# Patient Record
Sex: Female | Born: 1960
Health system: Southern US, Community
[De-identification: ages and names within clinical notes are randomized; demographics above are authoritative.]

## PROBLEM LIST (undated history)

## (undated) DIAGNOSIS — R112 Nausea with vomiting, unspecified: Secondary | ICD-10-CM

## (undated) DIAGNOSIS — Z923 Personal history of irradiation: Secondary | ICD-10-CM

## (undated) DIAGNOSIS — C50919 Malignant neoplasm of unspecified site of unspecified female breast: Secondary | ICD-10-CM

## (undated) DIAGNOSIS — E785 Hyperlipidemia, unspecified: Secondary | ICD-10-CM

## (undated) DIAGNOSIS — Z9889 Other specified postprocedural states: Secondary | ICD-10-CM

## (undated) DIAGNOSIS — M1990 Unspecified osteoarthritis, unspecified site: Secondary | ICD-10-CM

## (undated) DIAGNOSIS — M810 Age-related osteoporosis without current pathological fracture: Secondary | ICD-10-CM

## (undated) HISTORY — PX: LAPAROSCOPY: SHX197

## (undated) HISTORY — DX: Other specified postprocedural states: Z98.890

## (undated) HISTORY — DX: Age-related osteoporosis without current pathological fracture: M81.0

## (undated) HISTORY — DX: Hyperlipidemia, unspecified: E78.5

## (undated) HISTORY — DX: Unspecified osteoarthritis, unspecified site: M19.90

## (undated) HISTORY — DX: Other specified postprocedural states: R11.2

---

## 1991-12-23 HISTORY — PX: DILATION AND EVACUATION: SHX1459

## 1998-05-05 ENCOUNTER — Ambulatory Visit (HOSPITAL_COMMUNITY): Admission: RE | Admit: 1998-05-05 | Discharge: 1998-05-05 | Payer: Self-pay | Admitting: Obstetrics and Gynecology

## 1998-05-06 ENCOUNTER — Inpatient Hospital Stay (HOSPITAL_COMMUNITY): Admission: AD | Admit: 1998-05-06 | Discharge: 1998-05-06 | Payer: Self-pay | Admitting: *Deleted

## 1998-06-21 ENCOUNTER — Other Ambulatory Visit: Admission: RE | Admit: 1998-06-21 | Discharge: 1998-06-21 | Payer: Self-pay | Admitting: Obstetrics and Gynecology

## 1998-07-30 ENCOUNTER — Encounter (HOSPITAL_COMMUNITY): Admission: RE | Admit: 1998-07-30 | Discharge: 1998-10-28 | Payer: Self-pay | Admitting: Gynecology

## 1999-05-01 ENCOUNTER — Other Ambulatory Visit: Admission: RE | Admit: 1999-05-01 | Discharge: 1999-05-01 | Payer: Self-pay | Admitting: *Deleted

## 1999-11-30 ENCOUNTER — Inpatient Hospital Stay (HOSPITAL_COMMUNITY): Admission: AD | Admit: 1999-11-30 | Discharge: 1999-12-02 | Payer: Self-pay | Admitting: Obstetrics & Gynecology

## 1999-12-31 ENCOUNTER — Other Ambulatory Visit: Admission: RE | Admit: 1999-12-31 | Discharge: 1999-12-31 | Payer: Self-pay | Admitting: *Deleted

## 2001-08-12 ENCOUNTER — Other Ambulatory Visit: Admission: RE | Admit: 2001-08-12 | Discharge: 2001-08-12 | Payer: Self-pay | Admitting: *Deleted

## 2001-12-31 ENCOUNTER — Other Ambulatory Visit: Admission: RE | Admit: 2001-12-31 | Discharge: 2001-12-31 | Payer: Self-pay | Admitting: *Deleted

## 2003-02-08 ENCOUNTER — Other Ambulatory Visit: Admission: RE | Admit: 2003-02-08 | Discharge: 2003-02-08 | Payer: Self-pay | Admitting: Obstetrics & Gynecology

## 2010-12-22 HISTORY — PX: BREAST LUMPECTOMY: SHX2

## 2011-06-02 ENCOUNTER — Encounter: Payer: Self-pay | Admitting: Gastroenterology

## 2011-06-02 ENCOUNTER — Ambulatory Visit (AMBULATORY_SURGERY_CENTER): Payer: BC Managed Care – PPO | Admitting: *Deleted

## 2011-06-02 VITALS — Ht 67.0 in | Wt 127.0 lb

## 2011-06-02 DIAGNOSIS — Z1211 Encounter for screening for malignant neoplasm of colon: Secondary | ICD-10-CM

## 2011-06-02 MED ORDER — PEG-KCL-NACL-NASULF-NA ASC-C 100 G PO SOLR: ORAL | Status: DC

## 2011-06-16 ENCOUNTER — Encounter: Payer: Self-pay | Admitting: Gastroenterology

## 2011-06-16 ENCOUNTER — Ambulatory Visit (AMBULATORY_SURGERY_CENTER): Payer: BC Managed Care – PPO | Admitting: Gastroenterology

## 2011-06-16 VITALS — BP 121/56 | HR 78 | Temp 98.4°F | Resp 18 | Ht 67.0 in | Wt 127.0 lb

## 2011-06-16 DIAGNOSIS — Z1211 Encounter for screening for malignant neoplasm of colon: Secondary | ICD-10-CM

## 2011-06-16 HISTORY — PX: COLONOSCOPY: SHX174

## 2011-06-16 MED ORDER — SODIUM CHLORIDE 0.9 % IV SOLN: 500.0000 mL | INTRAVENOUS | Status: DC

## 2011-06-16 NOTE — Patient Instructions (Signed)
Please review discharge instruction

## 2011-06-17 ENCOUNTER — Telehealth: Payer: Self-pay | Admitting: *Deleted

## 2011-06-17 NOTE — Telephone Encounter (Signed)
No ID on answering machine, no message left 

## 2011-06-17 NOTE — Telephone Encounter (Deleted)
Follow up Call- Patient questions:  Do you have a fever, pain , or abdominal swelling? {yes no:314532} Pain Score  {NUMBERS; 0-10:5044} *  Have you tolerated food without any problems? {yes no:314532}  Have you been able to return to your normal activities? {yes no:314532}  Do you have any questions about your discharge instructions: Diet   {yes no:314532} Medications  {yes no:314532} Follow up visit  {yes no:314532}  Do you have questions or concerns about your Care? {yes no:314532}  Actions: * If pain score is 4 or above: {ACTION; [facility] ENDO PAIN >4:21563::"No action needed, pain <4."}

## 2011-10-09 ENCOUNTER — Other Ambulatory Visit: Payer: Self-pay

## 2011-10-15 ENCOUNTER — Other Ambulatory Visit: Payer: Self-pay | Admitting: Radiology

## 2011-10-15 DIAGNOSIS — D0501 Lobular carcinoma in situ of right breast: Secondary | ICD-10-CM

## 2011-10-17 ENCOUNTER — Ambulatory Visit
Admission: RE | Admit: 2011-10-17 | Discharge: 2011-10-17 | Disposition: A | Discharge status: Home / Self Care | Payer: BC Managed Care – PPO | Source: Ambulatory Visit | Attending: Radiology | Admitting: Radiology

## 2011-10-17 DIAGNOSIS — D0501 Lobular carcinoma in situ of right breast: Secondary | ICD-10-CM

## 2011-10-17 MED ORDER — GADOBENATE DIMEGLUMINE 529 MG/ML IV SOLN
11.0000 mL | Freq: Once | INTRAVENOUS | Status: AC | PRN
  Administered 2011-10-17: 11 mL via INTRAVENOUS

## 2011-10-21 ENCOUNTER — Encounter (INDEPENDENT_AMBULATORY_CARE_PROVIDER_SITE_OTHER): Payer: Self-pay | Admitting: General Surgery

## 2011-10-21 ENCOUNTER — Ambulatory Visit (INDEPENDENT_AMBULATORY_CARE_PROVIDER_SITE_OTHER): Payer: BC Managed Care – PPO | Admitting: General Surgery

## 2011-10-21 VITALS — BP 132/84 | HR 88 | Temp 99.5°F | Resp 16 | Ht 67.0 in | Wt 124.2 lb

## 2011-10-21 DIAGNOSIS — D0590 Unspecified type of carcinoma in situ of unspecified breast: Secondary | ICD-10-CM

## 2011-10-21 DIAGNOSIS — D0501 Lobular carcinoma in situ of right breast: Secondary | ICD-10-CM | POA: Insufficient documentation

## 2011-10-21 NOTE — Progress Notes (Signed)
Chief Complaint  Patient presents with  . Other    new pt- eval rt br radial scar    HPI Connie Jones is a 50 y.o. female.  Referred by Dr. Dag Pavic HPI 50 yof who is otherwise healthy underwent her regular screening MMG 10/12 with finding of 3 small areas of calcifcation in the central to lateral periareolar breast.  Magnification views showed a cluster of mildly pleomorphic calcifications in the 12:00 position anteriorly.  The others appeared benign.  Stereotactic biopsy was performed with finding of ADH, LCIS arising in a radial scar.  Subsequently she underwent an MR that showed a 1.9x1.5x1.4 cm area at the biopsy proven LCIS.  There is also a 2.4x1.6x1.6 cm area of patchy non-mass enhancement at the location of some of the calcifications.  She reports no complaints referable to her breasts.    Past Medical History  Diagnosis Date  . Arthritis     mild in knees    Past Surgical History  Procedure Date  . Cesarean section   . Laparoscopy     Infertility studies    Family History  Problem Relation Age of Onset  . Colon polyps Father   . Colon cancer Paternal Grandfather   . Cancer Paternal Grandfather     colon    Social History History  Substance Use Topics  . Smoking status: Never Smoker   . Smokeless tobacco: Never Used  . Alcohol Use: 0.0 oz/week     rarely    No Known Allergies  Current Outpatient Prescriptions  Medication Sig Dispense Refill  . calcium citrate-vitamin D 200-200 MG-UNIT TABS Take 2 tablets by mouth 2 (two) times daily.        . glucosamine-chondroitin 500-400 MG tablet Take 3 tablets by mouth daily.        . Multiple Vitamins-Minerals (MULTIVITAMIN WITH MINERALS) tablet Take 1 tablet by mouth daily.         Current Facility-Administered Medications  Medication Dose Route Frequency Provider Last Rate Last Dose  . 0.9 %  sodium chloride infusion  500 mL Intravenous Continuous Daniel Jacobs, MD        Review of Systems Review of Systems    Constitutional: Negative for fever, chills and unexpected weight change.  HENT: Negative for hearing loss, congestion, sore throat, trouble swallowing and voice change.   Eyes: Negative for visual disturbance.  Respiratory: Negative for cough and wheezing.   Cardiovascular: Negative for chest pain, palpitations and leg swelling.  Gastrointestinal: Negative for nausea, vomiting, abdominal pain, diarrhea, constipation, blood in stool, abdominal distention and anal bleeding.  Genitourinary: Negative for hematuria, vaginal bleeding and difficulty urinating.  Musculoskeletal: Negative for arthralgias.  Skin: Negative for rash and wound.  Neurological: Negative for seizures, syncope and headaches.  Hematological: Negative for adenopathy. Does not bruise/bleed easily.  Psychiatric/Behavioral: Negative for confusion.    Blood pressure 132/84, pulse 88, temperature 99.5 F (37.5 C), temperature source Temporal, resp. rate 16, height 5' 7" (1.702 m), weight 124 lb 3.2 oz (56.337 kg).  Physical Exam Physical Exam  Constitutional: She appears well-developed and well-nourished.  Eyes: No scleral icterus.  Neck: Neck supple.  Cardiovascular: Normal rate, regular rhythm and normal heart sounds.   Pulmonary/Chest: Effort normal and breath sounds normal. She has no wheezes. She has no rales. Right breast exhibits no inverted nipple, no mass, no nipple discharge, no skin change and no tenderness. Left breast exhibits no inverted nipple, no mass, no nipple discharge, no skin change and   no tenderness. Breasts are symmetrical.  Lymphadenopathy:    She has no cervical adenopathy.    Data Reviewed MMG/us/MRI all reviewed along with pathology  Assessment    Right breast MMG abnormality with biopsy showing ADH, LCIS involved with CSL RIght breast MRI abnormality    Plan    I discussed the findings of biopsy as well as imaging findings for about an hour.   I recommended that she undergo an MR biopsy  of the patchy non mass enhancement that is associated with benign appearing calcifications.  The MR has been done prior to me seeing her today.  At this point with know LCIS/ADH and an abnormality on MR we will need to biopsy before deciding on a final surgical plan.  We will set this up for soon. I recommended that the other area undergo wire localization and excision.  There is a possibility that breast cancer could exist in this mass and we need to ensure there is not.  We discussed the procedure and the risks/benefits associated with it.  She understands if there is breast cancer found we would need to perform additional surgery most likely.  She understands if this is ADH/LCIS this is a high risk lesion and I will refer her to the cancer center and the risk reduction clinic for evaluation.   We will wait on the MR biopsy and then determine a final plan.       Pippa Hanif 10/21/2011, 8:14 PM    

## 2011-10-22 ENCOUNTER — Other Ambulatory Visit (INDEPENDENT_AMBULATORY_CARE_PROVIDER_SITE_OTHER): Payer: Self-pay | Admitting: General Surgery

## 2011-10-22 DIAGNOSIS — D0501 Lobular carcinoma in situ of right breast: Secondary | ICD-10-CM

## 2011-10-23 ENCOUNTER — Telehealth (INDEPENDENT_AMBULATORY_CARE_PROVIDER_SITE_OTHER): Payer: Self-pay

## 2011-10-23 NOTE — Telephone Encounter (Signed)
[name] pt's cell and home # to get her scheduled for a recheck visit after the MRI Bx. Per Dr Dwain Sarna can put pt on for 10-27-11.Connie Jones

## 2011-10-24 ENCOUNTER — Telehealth (INDEPENDENT_AMBULATORY_CARE_PROVIDER_SITE_OTHER): Payer: Self-pay

## 2011-10-24 ENCOUNTER — Ambulatory Visit
Admission: RE | Admit: 2011-10-24 | Discharge: 2011-10-24 | Disposition: A | Discharge status: Home / Self Care | Payer: BC Managed Care – PPO | Source: Ambulatory Visit | Attending: General Surgery | Admitting: General Surgery

## 2011-10-24 DIAGNOSIS — D0501 Lobular carcinoma in situ of right breast: Secondary | ICD-10-CM

## 2011-10-24 MED ORDER — GADOBENATE DIMEGLUMINE 529 MG/ML IV SOLN
11.0000 mL | Freq: Once | INTRAVENOUS | Status: AC | PRN
  Administered 2011-10-24: 11 mL via INTRAVENOUS

## 2011-10-24 NOTE — Telephone Encounter (Signed)
[name] at home and cell for pt to come see Dr Dwain Sarna on Monday 10-27-11/ [facility]

## 2011-10-27 ENCOUNTER — Telehealth (INDEPENDENT_AMBULATORY_CARE_PROVIDER_SITE_OTHER): Payer: Self-pay

## 2011-10-27 ENCOUNTER — Ambulatory Visit (INDEPENDENT_AMBULATORY_CARE_PROVIDER_SITE_OTHER): Payer: BC Managed Care – PPO | Admitting: General Surgery

## 2011-10-27 ENCOUNTER — Encounter (INDEPENDENT_AMBULATORY_CARE_PROVIDER_SITE_OTHER): Payer: Self-pay | Admitting: General Surgery

## 2011-10-27 ENCOUNTER — Other Ambulatory Visit (INDEPENDENT_AMBULATORY_CARE_PROVIDER_SITE_OTHER): Payer: Self-pay | Admitting: General Surgery

## 2011-10-27 VITALS — BP 120/82 | HR 60 | Temp 97.8°F | Resp 16 | Ht 67.0 in | Wt 121.4 lb

## 2011-10-27 DIAGNOSIS — R928 Other abnormal and inconclusive findings on diagnostic imaging of breast: Secondary | ICD-10-CM

## 2011-10-27 DIAGNOSIS — N63 Unspecified lump in unspecified breast: Secondary | ICD-10-CM

## 2011-10-27 NOTE — Telephone Encounter (Signed)
Pt returned my call and I did make her an appt for today with DR Dwain Sarna.Connie Jones

## 2011-10-27 NOTE — Progress Notes (Signed)
Subjective:     Patient ID: VALARIA KOHUT, female   DOB: Sep 10, 1961, 50 y.o.   MRN: [id number]  HPI This is a 50 year old female who I saw last week after she underwent a mammogram with an abnormality and a biopsy showing atypical ductal hyperplasia and lobular carcinoma in situ. Following day she underwent an MRI which showed additional area of abnormality. This is now the MR biopsied showing a radial scar. I have reviewed her images with Dr. Jean Rosenthal. She reports no complaints.  Review of Systems     Objective:   Physical Exam     Assessment:     Right breast radial scar and right breast adh and lcis    Plan:        After reviewing the case with radiology I think the best plan would be to excise both of these areas. They are 1.2 cm away on her mammogram. She does have a fairly small breasts and I'm concerned about removing a large amount of tissue on her. I think he would be most reasonable to wire localize both of these areas and removed the tissue in between them as well. At a minimum she'll need to go to the risk reduction clinic in followup with MRIs as well as mammograms. If there is any cancer we will need to do another operation. We discussed the risks associated with we'll plan on performing this as soon as possible.

## 2011-10-27 NOTE — Telephone Encounter (Signed)
[name] on cell and home #'s again for pt to come in for appt with Dr Dwain Sarna today. I left directions if pt calls in to office today I want to speak to pt pls. Do not send to my voicemail./ [facility]

## 2011-11-03 ENCOUNTER — Encounter (INDEPENDENT_AMBULATORY_CARE_PROVIDER_SITE_OTHER): Payer: Self-pay | Admitting: General Surgery

## 2011-11-04 ENCOUNTER — Other Ambulatory Visit: Payer: Self-pay

## 2011-11-04 ENCOUNTER — Encounter (HOSPITAL_BASED_OUTPATIENT_CLINIC_OR_DEPARTMENT_OTHER)
Admission: RE | Admit: 2011-11-04 | Discharge: 2011-11-04 | Disposition: A | Discharge status: Home / Self Care | Payer: BC Managed Care – PPO | Source: Ambulatory Visit | Attending: General Surgery | Admitting: General Surgery

## 2011-11-04 ENCOUNTER — Encounter (HOSPITAL_BASED_OUTPATIENT_CLINIC_OR_DEPARTMENT_OTHER): Payer: Self-pay | Admitting: *Deleted

## 2011-11-04 LAB — BASIC METABOLIC PANEL
BUN: 11 mg/dL (ref 6–23)
CO2: 29 mEq/L (ref 19–32)
Glucose, Bld: 100 mg/dL — ABNORMAL HIGH (ref 70–99)
Potassium: 4.4 mEq/L (ref 3.5–5.1)
Sodium: 139 mEq/L (ref 135–145)

## 2011-11-04 LAB — CBC
Hemoglobin: 14 g/dL (ref 12.0–15.0)
MCH: 31.7 pg (ref 26.0–34.0)
MCV: 93.7 fL (ref 78.0–100.0)
RBC: 4.41 MIL/uL (ref 3.87–5.11)

## 2011-11-04 NOTE — Progress Notes (Signed)
To come in for labs and ekg All p/p mcsc reviewed

## 2011-11-05 ENCOUNTER — Other Ambulatory Visit (INDEPENDENT_AMBULATORY_CARE_PROVIDER_SITE_OTHER): Payer: Self-pay | Admitting: General Surgery

## 2011-11-05 ENCOUNTER — Ambulatory Visit (HOSPITAL_BASED_OUTPATIENT_CLINIC_OR_DEPARTMENT_OTHER)
Admission: RE | Admit: 2011-11-05 | Discharge: 2011-11-06 | Disposition: A | Discharge status: Home / Self Care | Payer: BC Managed Care – PPO | Source: Ambulatory Visit | Attending: General Surgery | Admitting: General Surgery

## 2011-11-05 ENCOUNTER — Encounter (HOSPITAL_BASED_OUTPATIENT_CLINIC_OR_DEPARTMENT_OTHER): Payer: Self-pay | Admitting: Anesthesiology

## 2011-11-05 ENCOUNTER — Ambulatory Visit (HOSPITAL_BASED_OUTPATIENT_CLINIC_OR_DEPARTMENT_OTHER): Payer: BC Managed Care – PPO | Admitting: Anesthesiology

## 2011-11-05 ENCOUNTER — Other Ambulatory Visit (INDEPENDENT_AMBULATORY_CARE_PROVIDER_SITE_OTHER): Payer: Self-pay | Admitting: Surgery

## 2011-11-05 ENCOUNTER — Encounter (HOSPITAL_BASED_OUTPATIENT_CLINIC_OR_DEPARTMENT_OTHER): Payer: Self-pay | Admitting: *Deleted

## 2011-11-05 ENCOUNTER — Encounter (HOSPITAL_BASED_OUTPATIENT_CLINIC_OR_DEPARTMENT_OTHER)
Admission: RE | Disposition: A | Discharge status: Home / Self Care | Payer: Self-pay | Source: Ambulatory Visit | Attending: General Surgery

## 2011-11-05 DIAGNOSIS — D0590 Unspecified type of carcinoma in situ of unspecified breast: Secondary | ICD-10-CM

## 2011-11-05 DIAGNOSIS — N6089 Other benign mammary dysplasias of unspecified breast: Secondary | ICD-10-CM | POA: Insufficient documentation

## 2011-11-05 DIAGNOSIS — Z01810 Encounter for preprocedural cardiovascular examination: Secondary | ICD-10-CM | POA: Insufficient documentation

## 2011-11-05 DIAGNOSIS — Z01812 Encounter for preprocedural laboratory examination: Secondary | ICD-10-CM | POA: Insufficient documentation

## 2011-11-05 HISTORY — PX: BREAST BIOPSY: SHX20

## 2011-11-05 SURGERY — BREAST BIOPSY WITH NEEDLE LOCALIZATION
Anesthesia: General | Site: Breast | Laterality: Right | Wound class: Clean

## 2011-11-05 MED ORDER — LACTATED RINGERS IV SOLN
INTRAVENOUS | Status: DC
  Administered 2011-11-05 (×4): via INTRAVENOUS

## 2011-11-05 MED ORDER — FENTANYL CITRATE 0.05 MG/ML IJ SOLN
INTRAMUSCULAR | Status: DC | PRN
  Administered 2011-11-05: 100 ug via INTRAVENOUS

## 2011-11-05 MED ORDER — PROMETHAZINE HCL 25 MG/ML IJ SOLN
12.5000 mg | Freq: Four times a day (QID) | INTRAMUSCULAR | Status: DC | PRN
  Administered 2011-11-05: 12.5 mg via INTRAVENOUS

## 2011-11-05 MED ORDER — CEFAZOLIN SODIUM 1-5 GM-% IV SOLN
1.0000 g | INTRAVENOUS | Status: AC
  Administered 2011-11-05: 1 g via INTRAVENOUS

## 2011-11-05 MED ORDER — ONDANSETRON HCL 4 MG/2ML IJ SOLN
4.0000 mg | Freq: Once | INTRAMUSCULAR | Status: AC | PRN
  Administered 2011-11-05: 4 mg via INTRAVENOUS

## 2011-11-05 MED ORDER — MIDAZOLAM HCL 5 MG/5ML IJ SOLN
INTRAMUSCULAR | Status: DC | PRN
  Administered 2011-11-05: 2 mg via INTRAVENOUS
  Administered 2011-11-05: 1 mg via INTRAVENOUS

## 2011-11-05 MED ORDER — BUPIVACAINE HCL (PF) 0.25 % IJ SOLN
INTRAMUSCULAR | Status: DC | PRN
  Administered 2011-11-05: 10 mL

## 2011-11-05 MED ORDER — MORPHINE SULFATE 2 MG/ML IJ SOLN: 2.0000 mg | INTRAMUSCULAR | Status: DC | PRN

## 2011-11-05 MED ORDER — ONDANSETRON HCL 4 MG/2ML IJ SOLN
INTRAMUSCULAR | Status: DC | PRN
  Administered 2011-11-05: 4 mg via INTRAVENOUS

## 2011-11-05 MED ORDER — SODIUM CHLORIDE 0.45 % IV SOLN: INTRAVENOUS | Status: DC

## 2011-11-05 MED ORDER — PROPOFOL 10 MG/ML IV EMUL
INTRAVENOUS | Status: DC | PRN
  Administered 2011-11-05: 200 mg via INTRAVENOUS

## 2011-11-05 MED ORDER — MEPERIDINE HCL 25 MG/ML IJ SOLN: 6.2500 mg | INTRAMUSCULAR | Status: DC | PRN

## 2011-11-05 MED ORDER — DROPERIDOL 2.5 MG/ML IJ SOLN
0.6250 mg | Freq: Once | INTRAMUSCULAR | Status: AC
  Administered 2011-11-05: 0.625 mg via INTRAVENOUS

## 2011-11-05 MED ORDER — LIDOCAINE HCL (CARDIAC) 20 MG/ML IV SOLN
INTRAVENOUS | Status: DC | PRN
  Administered 2011-11-05: 100 mg via INTRAVENOUS

## 2011-11-05 MED ORDER — OXYCODONE-ACETAMINOPHEN 5-325 MG PO TABS: 1.0000 | ORAL_TABLET | ORAL | Status: AC | PRN

## 2011-11-05 MED ORDER — OXYCODONE-ACETAMINOPHEN 5-325 MG PO TABS: 1.0000 | ORAL_TABLET | ORAL | Status: DC | PRN

## 2011-11-05 MED ORDER — HYDROMORPHONE HCL PF 1 MG/ML IJ SOLN
0.2500 mg | INTRAMUSCULAR | Status: DC | PRN
  Administered 2011-11-05: 0.25 mg via INTRAVENOUS
  Administered 2011-11-05 (×2): 0.5 mg via INTRAVENOUS
  Administered 2011-11-05: 0.25 mg via INTRAVENOUS

## 2011-11-05 MED ORDER — ONDANSETRON HCL 4 MG/2ML IJ SOLN: 4.0000 mg | Freq: Four times a day (QID) | INTRAMUSCULAR | Status: DC | PRN

## 2011-11-05 SURGICAL SUPPLY — 51 items
APL SKNCLS STERI-STRIP NONHPOA (GAUZE/BANDAGES/DRESSINGS) ×1
APPLIER CLIP 9.375 MED OPEN (MISCELLANEOUS)
BENZOIN TINCTURE PRP APPL 2/3 (GAUZE/BANDAGES/DRESSINGS) ×2 IMPLANT
BINDER BREAST LRG (GAUZE/BANDAGES/DRESSINGS) ×2 IMPLANT
BINDER BREAST MEDIUM (GAUZE/BANDAGES/DRESSINGS) IMPLANT
BINDER BREAST XLRG (GAUZE/BANDAGES/DRESSINGS) IMPLANT
BINDER BREAST XXLRG (GAUZE/BANDAGES/DRESSINGS) IMPLANT
BLADE SURG 15 STRL LF DISP TIS (BLADE) ×1 IMPLANT
BLADE SURG 15 STRL SS (BLADE) ×2
CANISTER SUCTION 1200CC (MISCELLANEOUS) IMPLANT
CHLORAPREP W/TINT 26ML (MISCELLANEOUS) ×2 IMPLANT
CLIP APPLIE 9.375 MED OPEN (MISCELLANEOUS) IMPLANT
CLOTH BEACON ORANGE TIMEOUT ST (SAFETY) ×2 IMPLANT
COVER MAYO STAND STRL (DRAPES) ×2 IMPLANT
COVER TABLE BACK 60X90 (DRAPES) ×2 IMPLANT
DECANTER SPIKE VIAL GLASS SM (MISCELLANEOUS) IMPLANT
DERMABOND ADVANCED (GAUZE/BANDAGES/DRESSINGS)
DERMABOND ADVANCED .7 DNX12 (GAUZE/BANDAGES/DRESSINGS) IMPLANT
DEVICE DUBIN W/COMP PLATE 8390 (MISCELLANEOUS) ×2 IMPLANT
DRAPE PED LAPAROTOMY (DRAPES) ×2 IMPLANT
DRSG TEGADERM 4X4.75 (GAUZE/BANDAGES/DRESSINGS) ×2 IMPLANT
ELECT COATED BLADE 2.86 ST (ELECTRODE) ×2 IMPLANT
ELECT REM PT RETURN 9FT ADLT (ELECTROSURGICAL) ×2
ELECTRODE REM PT RTRN 9FT ADLT (ELECTROSURGICAL) ×1 IMPLANT
GAUZE SPONGE 4X4 12PLY STRL LF (GAUZE/BANDAGES/DRESSINGS) ×2 IMPLANT
GLOVE BIO SURGEON STRL SZ7 (GLOVE) ×4 IMPLANT
GLOVE BIOGEL PI IND STRL 7.5 (GLOVE) ×1 IMPLANT
GLOVE BIOGEL PI INDICATOR 7.5 (GLOVE) ×1
GOWN PREVENTION PLUS XLARGE (GOWN DISPOSABLE) ×2 IMPLANT
GOWN STRL REIN 2XL LVL4 (GOWN DISPOSABLE) ×2 IMPLANT
NEEDLE HYPO 25X1 1.5 SAFETY (NEEDLE) ×2 IMPLANT
NS IRRIG 1000ML POUR BTL (IV SOLUTION) IMPLANT
PACK BASIN DAY SURGERY FS (CUSTOM PROCEDURE TRAY) ×2 IMPLANT
PENCIL BUTTON HOLSTER BLD 10FT (ELECTRODE) ×2 IMPLANT
SLEEVE SCD COMPRESS KNEE MED (MISCELLANEOUS) ×2 IMPLANT
SPONGE GAUZE 2X2 12PLY UNSTER (GAUZE/BANDAGES/DRESSINGS) ×2 IMPLANT
SPONGE LAP 4X18 X RAY DECT (DISPOSABLE) ×2 IMPLANT
STRIP CLOSURE SKIN 1/2X4 (GAUZE/BANDAGES/DRESSINGS) ×2 IMPLANT
SUT MNCRL AB 4-0 PS2 18 (SUTURE) ×2 IMPLANT
SUT SILK 2 0 SH (SUTURE) ×2 IMPLANT
SUT VIC AB 2-0 SH 27 (SUTURE) ×1
SUT VIC AB 2-0 SH 27XBRD (SUTURE) ×1 IMPLANT
SUT VIC AB 3-0 SH 27 (SUTURE) ×2
SUT VIC AB 3-0 SH 27X BRD (SUTURE) ×1 IMPLANT
SUT VICRYL AB 3 0 TIES (SUTURE) IMPLANT
SYR CONTROL 10ML LL (SYRINGE) ×2 IMPLANT
TOWEL OR 17X24 6PK STRL BLUE (TOWEL DISPOSABLE) ×2 IMPLANT
TOWEL OR NON WOVEN STRL DISP B (DISPOSABLE) ×2 IMPLANT
TUBE CONNECTING 20X1/4 (TUBING) IMPLANT
WATER STERILE IRR 1000ML POUR (IV SOLUTION) ×2 IMPLANT
YANKAUER SUCT BULB TIP NO VENT (SUCTIONS) IMPLANT

## 2011-11-05 NOTE — Anesthesia Procedure Notes (Addendum)
Performed by: Radford Pax   Procedure Name: LMA Insertion Date/Time: 11/05/2011 4:34 PM Performed by: Radford Pax Pre-anesthesia Checklist: Patient identified, Timeout performed, Emergency Drugs available, Patient being monitored and Suction available Patient Re-evaluated:Patient Re-evaluated prior to inductionOxygen Delivery Method: Circle System Utilized Preoxygenation: Pre-oxygenation with 100% oxygen Intubation Type: IV induction Ventilation: Mask ventilation without difficulty LMA: LMA inserted LMA Size: 4.0 Number of attempts: 1 Placement Confirmation: positive ETCO2 Tube secured with: Tape (plastic tape, bite gard right posterior) Dental Injury: Teeth and Oropharynx as per pre-operative assessment

## 2011-11-05 NOTE — Addendum Note (Signed)
Addendum  created 11/05/11 1836 by Kerby Nora, MD   Modules edited:Orders

## 2011-11-05 NOTE — Progress Notes (Signed)
Patient continues to experience nausea and vomiting despite antiemetic meds and volume infusion. Has not been able to tolerate even ice chips po. Dr. Ivin Booty in to evaluate. Advised overnight stay in [facility]. Dr. Luisa Hart paged and responded. In agreement with plan to keep patient overnight. Will place orders.

## 2011-11-05 NOTE — Anesthesia Preprocedure Evaluation (Addendum)
Anesthesia Evaluation  Patient identified by MRN, date of birth, ID band Patient awake    Reviewed: Allergy & Precautions, H&P , Patient's Chart, lab work & pertinent test results  Airway       Dental   Pulmonary          Cardiovascular     Neuro/Psych    GI/Hepatic   Endo/Other    Renal/GU      Musculoskeletal   Abdominal   Peds  Hematology   Anesthesia Other Findings   Reproductive/Obstetrics                           Anesthesia Physical Anesthesia Plan  ASA: II  Anesthesia Plan: General   Post-op Pain Management:    Induction:   Airway Management Planned:   Additional Equipment:   Intra-op Plan:   Post-operative Plan:   Informed Consent: I have reviewed the patients History and Physical, chart, labs and discussed the procedure including the risks, benefits and alternatives for the proposed anesthesia with the patient or authorized representative who has indicated his/her understanding and acceptance.     Plan Discussed with: CRNA and Surgeon  Anesthesia Plan Comments:         Anesthesia Quick Evaluation  

## 2011-11-05 NOTE — Interval H&P Note (Signed)
History and Physical Interval Note:   11/05/2011   3:15 PM   Connie Jones  has presented today for surgery, with the diagnosis of rt breast mass  The various methods of treatment have been discussed with the patient and family. After consideration of risks, benefits and other options for treatment, the patient has consented to  Procedure(s): BREAST BIOPSY WITH NEEDLE LOCALIZATION as a surgical intervention .  The patients' history has been reviewed, patient examined, no change in status, stable for surgery.  I have reviewed the patients' chart and labs.  Questions were answered to the patient's satisfaction.     [facility]  MD

## 2011-11-05 NOTE — Anesthesia Postprocedure Evaluation (Signed)
  Anesthesia Post-op Note  Patient: Connie Jones  Procedure(s) Performed:  BREAST BIOPSY WITH NEEDLE LOCALIZATION - needle localization times two at [facility] 12:30  Patient Location: PACU  Anesthesia Type: General  Level of Consciousness: awake, alert  and oriented  Airway and Oxygen Therapy: Patient Spontanous Breathing and Patient connected to face mask oxygen  Post-op Pain: mild  Post-op Assessment: Post-op Vital signs reviewed, Patient's Cardiovascular Status Stable, Respiratory Function Stable, Patent Airway, No signs of Nausea or vomiting and Pain level controlled  Post-op Vital Signs: Reviewed and stable  Complications: No apparent anesthesia complications

## 2011-11-05 NOTE — H&P (View-Only) (Signed)
Chief Complaint  Patient presents with  . Other    new pt- eval rt br radial scar    HPI Connie Jones is a 50 y.o. female.  Referred by Dr. Dominga Ferry HPI 47 yof who is otherwise healthy underwent her regular screening MMG 10/12 with finding of 3 small areas of calcifcation in the central to lateral periareolar breast.  Magnification views showed a cluster of mildly pleomorphic calcifications in the 12:00 position anteriorly.  The others appeared benign.  Stereotactic biopsy was performed with finding of ADH, LCIS arising in a radial scar.  Subsequently she underwent an MR that showed a 1.9x1.5x1.4 cm area at the biopsy proven LCIS.  There is also a 2.4x1.6x1.6 cm area of patchy non-mass enhancement at the location of some of the calcifications.  She reports no complaints referable to her breasts.    Past Medical History  Diagnosis Date  . Arthritis     mild in knees    Past Surgical History  Procedure Date  . Cesarean section   . Laparoscopy     Infertility studies    Family History  Problem Relation Age of Onset  . Colon polyps Father   . Colon cancer Paternal Grandfather   . Cancer Paternal Grandfather     colon    Social History History  Substance Use Topics  . Smoking status: Never Smoker   . Smokeless tobacco: Never Used  . Alcohol Use: 0.0 oz/week     rarely    No Known Allergies  Current Outpatient Prescriptions  Medication Sig Dispense Refill  . calcium citrate-vitamin D 200-200 MG-UNIT TABS Take 2 tablets by mouth 2 (two) times daily.        Marland Kitchen glucosamine-chondroitin 500-400 MG tablet Take 3 tablets by mouth daily.        . Multiple Vitamins-Minerals (MULTIVITAMIN WITH MINERALS) tablet Take 1 tablet by mouth daily.         Current Facility-Administered Medications  Medication Dose Route Frequency Provider Last Rate Last Dose  . 0.9 %  sodium chloride infusion  500 mL Intravenous Continuous Rob Bunting, MD        Review of Systems Review of Systems    Constitutional: Negative for fever, chills and unexpected weight change.  HENT: Negative for hearing loss, congestion, sore throat, trouble swallowing and voice change.   Eyes: Negative for visual disturbance.  Respiratory: Negative for cough and wheezing.   Cardiovascular: Negative for chest pain, palpitations and leg swelling.  Gastrointestinal: Negative for nausea, vomiting, abdominal pain, diarrhea, constipation, blood in stool, abdominal distention and anal bleeding.  Genitourinary: Negative for hematuria, vaginal bleeding and difficulty urinating.  Musculoskeletal: Negative for arthralgias.  Skin: Negative for rash and wound.  Neurological: Negative for seizures, syncope and headaches.  Hematological: Negative for adenopathy. Does not bruise/bleed easily.  Psychiatric/Behavioral: Negative for confusion.    Blood pressure 132/84, pulse 88, temperature 99.5 F (37.5 C), temperature source Temporal, resp. rate 16, height 5\' 7"  (1.702 m), weight 124 lb 3.2 oz (56.337 kg).  Physical Exam Physical Exam  Constitutional: She appears well-developed and well-nourished.  Eyes: No scleral icterus.  Neck: Neck supple.  Cardiovascular: Normal rate, regular rhythm and normal heart sounds.   Pulmonary/Chest: Effort normal and breath sounds normal. She has no wheezes. She has no rales. Right breast exhibits no inverted nipple, no mass, no nipple discharge, no skin change and no tenderness. Left breast exhibits no inverted nipple, no mass, no nipple discharge, no skin change and  no tenderness. Breasts are symmetrical.  Lymphadenopathy:    She has no cervical adenopathy.    Data Reviewed MMG/us/MRI all reviewed along with pathology  Assessment    Right breast MMG abnormality with biopsy showing ADH, LCIS involved with CSL RIght breast MRI abnormality    Plan    I discussed the findings of biopsy as well as imaging findings for about an hour.   I recommended that she undergo an MR biopsy  of the patchy non mass enhancement that is associated with benign appearing calcifications.  The MR has been done prior to me seeing her today.  At this point with know LCIS/ADH and an abnormality on MR we will need to biopsy before deciding on a final surgical plan.  We will set this up for soon. I recommended that the other area undergo wire localization and excision.  There is a possibility that breast cancer could exist in this mass and we need to ensure there is not.  We discussed the procedure and the risks/benefits associated with it.  She understands if there is breast cancer found we would need to perform additional surgery most likely.  She understands if this is ADH/LCIS this is a high risk lesion and I will refer her to the cancer center and the risk reduction clinic for evaluation.   We will wait on the MR biopsy and then determine a final plan.       Connie Jones 10/21/2011, 8:14 PM

## 2011-11-05 NOTE — Transfer of Care (Signed)
Immediate Anesthesia Transfer of Care Note  Patient: Connie Jones  Procedure(s) Performed:  BREAST BIOPSY WITH NEEDLE LOCALIZATION - needle localization times two at [facility] 12:30  Patient Location: PACU  Anesthesia Type: MAC  Level of Consciousness: sedated  Airway & Oxygen Therapy: Patient Spontanous Breathing and Patient connected to face mask  Post-op Assessment: Report given to PACU RN and Post -op Vital signs reviewed and stable  Post vital signs: Reviewed and stable  Complications: No apparent anesthesia complications

## 2011-11-05 NOTE — Op Note (Signed)
Preoperative diagnosis: Right breast microcalcifications with core biopsy showing LCIS and atypical ductal hyperplasia  Postoperative diagnosis: Same as above  Procedure: Right breast wire localization biopsy  Surgeon: Dr. Harden Mo  Anesthesia: Gen. With LMA  Estimated blood loss: Minimal  Drains: None  Sponge and needle counts correct x2 at end of operation  Findings: Mammogram with removal of clips, prior, lesion  Indications: This is a 50 year old female who underwent a routine screening mammogram with an abnormality that was biopsied. She also underwent an MRI biopsy of the lesion. Both of these areas show lobular carcinoma in situ and atypical ductal hyperplasia. We discussed a wire localization biopsy of this abnormal area. We discussed there may be cancer associated with this performed right now both of these lesions are benign. We discussed the risks and benefits prior to proceeding.  Procedure: She first had a  wire placed at the breast center. I have the mammograms available for my review during the operation. She was taken to the operating room and was administered 1 g of intravenous cefazolin. Sequential compression devices were placed on her lower extremities prior to induction with anesthesia. She was then placed under general anesthesia with an LMA. The right breast was prepped and draped in the standard sterile surgical fashion. A surgical timeout was then performed.  I made a periareolar incision. I then used cautery to excise the wire and the surrounding tissue. This was done without difficulty. I marked the specimen with a short stitch superior, long stitch lateral, and double stitch in the couple of other small areas that got separated from the lesion during the dissection as there was just some fatty tissue attaching them and these were sent with the specimen also. Mammogram was taken confirming removal of the clips, wire, and lesion. This was confirmed by radiology.  Hemostasis was then obtained. I closed this with a 2-0 Vicryl 3-0 Vicryl and a 4-0 Monocryl for the skin. I infiltrated 10 cc of quarter percent Marcaine upon completion. I then placed Steri-Strips and a sterile dressing. She tolerated this well and was transferred to the recovery room in stable condition.

## 2011-11-06 ENCOUNTER — Other Ambulatory Visit (INDEPENDENT_AMBULATORY_CARE_PROVIDER_SITE_OTHER): Payer: Self-pay | Admitting: General Surgery

## 2011-11-06 MED ORDER — PROMETHAZINE HCL 12.5 MG PO TABS: 12.5000 mg | ORAL_TABLET | Freq: Four times a day (QID) | ORAL | Status: AC | PRN

## 2011-11-07 ENCOUNTER — Encounter (INDEPENDENT_AMBULATORY_CARE_PROVIDER_SITE_OTHER): Payer: Self-pay | Admitting: General Surgery

## 2011-11-10 ENCOUNTER — Encounter (HOSPITAL_BASED_OUTPATIENT_CLINIC_OR_DEPARTMENT_OTHER): Payer: Self-pay | Admitting: General Surgery

## 2011-11-10 ENCOUNTER — Telehealth (INDEPENDENT_AMBULATORY_CARE_PROVIDER_SITE_OTHER): Payer: Self-pay

## 2011-11-10 NOTE — Telephone Encounter (Signed)
[name] of pt's scheduled appt time to come back and see Dr Dwain Sarna. I just advised pt to leave me a voicemail stating she did receive her appt time on her v.m./ [facility]

## 2011-11-11 ENCOUNTER — Telehealth (INDEPENDENT_AMBULATORY_CARE_PROVIDER_SITE_OTHER): Payer: Self-pay

## 2011-11-11 DIAGNOSIS — D0510 Intraductal carcinoma in situ of unspecified breast: Secondary | ICD-10-CM

## 2011-11-11 NOTE — Telephone Encounter (Signed)
Pt called in requesting her path report. I advised pt that Dr Dwain Sarna was not in the office the whole week but I would look up her path report. I advised pt that her path shows low grade DCIS with margins negative for malignancy. I advised pt that I could go ahead and send her referral to [facility] to see medical/radiation oncology. The pt has a lot of questions about her path report I trid to answer as much as possible but I explained that she will have to really talk to Dr Dwain Sarna on Monday. I did advise that the path report was good report if you were going to get a path report about the breast b/c it was an early detection of cancer cells for high risk. The pt understands will see [place] Monday./ [facility]

## 2011-11-17 ENCOUNTER — Ambulatory Visit (INDEPENDENT_AMBULATORY_CARE_PROVIDER_SITE_OTHER): Payer: BC Managed Care – PPO | Admitting: General Surgery

## 2011-11-17 ENCOUNTER — Encounter (INDEPENDENT_AMBULATORY_CARE_PROVIDER_SITE_OTHER): Payer: Self-pay | Admitting: General Surgery

## 2011-11-17 VITALS — BP 124/82 | HR 60 | Temp 98.0°F | Resp 16 | Ht 67.0 in | Wt 120.2 lb

## 2011-11-17 DIAGNOSIS — Z09 Encounter for follow-up examination after completed treatment for conditions other than malignant neoplasm: Secondary | ICD-10-CM

## 2011-11-17 DIAGNOSIS — D0511 Intraductal carcinoma in situ of right breast: Secondary | ICD-10-CM | POA: Insufficient documentation

## 2011-11-17 NOTE — Progress Notes (Signed)
Subjective:     Patient ID: Connie Jones, female   DOB: 1961-11-07, 50 y.o.   MRN: [id number]  HPI 61 yof who underwent excisional biopsy of ADH and LCIS on core biopsy of two right breast lesions.  She is doing well without any real complaints postop.  Her final path shows DCIS 2 mm in size that is low grade and 2 mm from her inferior margin.  She comes in today to discuss her pathology results and for further discussion about adjuvant therapy and surgical options.  Review of Systems     Objective:   Physical Exam Well healing right breast incision without infection    Assessment:     S/p right breast excisional biopsy with stage 0 right breast cancer    Plan:     We discussed at length today her diagnosis.  We went over all the therapies for breast cancer.  We specifically discussed her DCIS and the options of lumpectomy or mastectomy moving forward.  I think if she chooses lumpectomy our margins are 2 mm and as long as she undergoes radiation therapy this should be adequate.  I do think we should get another mmg to insure there are no other abnormalities.  If this is ok, then bct is reasonable. If this shows other abnormalities that remain we will need to discuss reoperation which may be a mastectomy.  We discussed local control with lumpectomy/xrt vs mastectomy as well as long term survival.  We also discussed radiation therapy consul prior to decision making as she has a lot of questions about radiation therapy as well as its side effects.  We discussed antiestrogen therapy as well.  We will get her mmg early next week due to postop soreness and then follow up soon after that.

## 2011-11-18 ENCOUNTER — Telehealth (INDEPENDENT_AMBULATORY_CARE_PROVIDER_SITE_OTHER): Payer: Self-pay

## 2011-11-18 DIAGNOSIS — D0510 Intraductal carcinoma in situ of unspecified breast: Secondary | ICD-10-CM

## 2011-11-18 NOTE — Telephone Encounter (Signed)
[name] for pt giving her an appt with Dr Hal Neer for 11-19-11 @2 :45/ [facility]

## 2011-11-18 NOTE — Telephone Encounter (Signed)
Called pt to notify her of the mgm date for repeat right side mgm on 11-28-11 @9 :50/ [facility]

## 2011-11-19 ENCOUNTER — Encounter: Payer: Self-pay | Admitting: Radiation Oncology

## 2011-11-19 ENCOUNTER — Ambulatory Visit
Admission: RE | Admit: 2011-11-19 | Discharge: 2011-11-19 | Disposition: A | Discharge status: Home / Self Care | Payer: BC Managed Care – PPO | Source: Ambulatory Visit | Attending: Radiation Oncology | Admitting: Radiation Oncology

## 2011-11-19 VITALS — BP 113/77 | HR 94 | Temp 98.5°F | Ht 67.0 in | Wt 122.8 lb

## 2011-11-19 DIAGNOSIS — Y842 Radiological procedure and radiotherapy as the cause of abnormal reaction of the patient, or of later complication, without mention of misadventure at the time of the procedure: Secondary | ICD-10-CM | POA: Insufficient documentation

## 2011-11-19 DIAGNOSIS — Z510 Encounter for antineoplastic radiation therapy: Secondary | ICD-10-CM | POA: Insufficient documentation

## 2011-11-19 DIAGNOSIS — D0590 Unspecified type of carcinoma in situ of unspecified breast: Secondary | ICD-10-CM | POA: Insufficient documentation

## 2011-11-19 DIAGNOSIS — Z170 Estrogen receptor positive status [ER+]: Secondary | ICD-10-CM | POA: Insufficient documentation

## 2011-11-19 DIAGNOSIS — L589 Radiodermatitis, unspecified: Secondary | ICD-10-CM | POA: Insufficient documentation

## 2011-11-19 DIAGNOSIS — D0510 Intraductal carcinoma in situ of unspecified breast: Secondary | ICD-10-CM

## 2011-11-19 HISTORY — DX: Malignant neoplasm of unspecified site of unspecified female breast: C50.919

## 2011-11-19 NOTE — Progress Notes (Signed)
Married Menses age 50 LMO 2010 took low dose HRT  For 2 years Children ages 37 and 83

## 2011-11-19 NOTE — Progress Notes (Signed)
Please see the Nurse Progress Note in the MD Initial Consult Encounter for this patient. 

## 2011-11-21 NOTE — Progress Notes (Signed)
CC:   Connie Gosling, MD  DIAGNOSIS:  Ductal carcinoma in situ of the right breast.  PREVIOUS INTERVENTIONS:  Right lumpectomy on 11/05/2011 revealing a 0.2 cm low-grade ductal carcinoma in situ on the background of a complex sclerosing lesion with microcalcifications and a 0.2 inferior margin, ER/PR positive.  HISTORY OF PRESENT ILLNESS:  Connie Jones is a pleasant 50 year old female who went in for a regular screening mammogram.  This showed 3 areas of calcification in the lateral aspect of the breast.  Magnification views showed a cluster of pleomorphic calcifications in the anterior 12 o'clock position.  The other areas on magnification views appeared benign.  A stereotactic biopsy was performed of these pleomorphic calcifications showing ADH and LCIS arising in a radial scar.  An MRI of the bilateral breasts was performed on 10/17/2011.  This showed nodular non masslike enhancement area measuring 1.9 x 1.5 cm.  A 2.4 x 1.6 x 1.6 area of non masslike enhancement was also noted in the upper outer quadrant of the right breast at the location of the additional microcalcifications.  This was concerning for LCIS, and consideration of biopsy is warranted.  An MRI-guided biopsy was performed on 10/24/2011, revealing a complex sclerosing lesion with atypical lobular hyperplasia. Excision was recommended.  After discussion with the patient, Dr. Dwain Sarna performed excision of both these areas on 11/05/2011.  Ductal carcinoma in situ was noted on the background of complex sclerosing lesion with microcalcifications and previous biopsy changes.  The closest margin was inferiorly at 0.2 cm.  Dr. Dwain Sarna discussed the results of the biopsy with her.  I should note that her specimen mammogram showed all calcifications to excised.  Dr. Dwain Sarna referred her to me for consideration of radiation in the management of her newly diagnosed DCIS.  In speaking with Connie Jones, she is quite concerned  about the area seen on her biopsy.  She is also concerned that we are "over treating" her DCIS. She has a followup mammogram scheduled for December 7th and has healed up well from her surgery.  She has no breast related complaints today.  PAST MEDICAL HISTORY:  Breast cancer, arthritis, status post C-section, status post laparoscopy.  FAMILY HISTORY:  She had a sister with anesthesia difficulties.  There is no family history of malignancy.  SOCIAL HISTORY:  She is a nonsmoker.  She denies any smokeless tobacco use.  She admits to rare alcohol consumption.  She is married.  MEDICATIONS:  Multivitamin, calcium with vitamin D, and glucosamine and chondroitin.  GYN HISTORY:  She is GX, P2.  Her last menstrual period was in 2010, and she took low-dose hormone replacement therapy for 2 years.  Menses began at the age of 59.  REVIEW OF SYSTEMS:  As per the history of present illness.  All other systems reviewed and found to be negative.  PHYSICAL EXAMINATION:  Vital Signs:  Weight 122 pounds.  Height 5 feet, 7 inches, blood pressure 113/77, pulse 94.  General:  She is a pleasant female in no distress sitting comfortably on the examining room table. Lymphatics:  She has no palpable cervical or supraclavicular adenopathy. Breasts:  She has small breasts bilaterally.  Neurologic:  She is alert and oriented x3.  IMPRESSION:  A 50 year old female with ductal carcinoma in situ.  RECOMMENDATIONS:  Connie Jones, her husband and I discussed her diagnosis and options for treatment.  We discussed reexcision versus mastectomy.  The conundrum basically is her small breast size and what her cosmetic outcome would be  with further re-excision.  I do think that in a young woman at this age a reexcision is warranted, and I have discussed that with her.  I would also recommend breast radiation after lumpectomy to decrease the risk of local recurrence.  I also think that the LCIS and ADH that she has are  precursors to cancer, and she would benefit from treating those precursors in the rest of her breast.  We discussed 6 weeks of treatment as an outpatient.  We discussed in depth the process of simulation and the placement of tattoos.  We discussed the low likelihood of secondary malignancies which she had read about on the Internet.  We discussed the secondary malignancy seen in patients who smoked in terms of lung cancer as well as angiosarcoma in patients who have chronic lymphedema.  We discussed the possibility of lung damage and the role of CT simulation in decreasing that likelihood.  We discussed that she would likely not need radiation if she elected for mastectomy and that she would be a candidate for reconstruction.  We discussed the cosmetic result that she could expect and discussed the likelihood of fibrosis and breast size asymmetry.  I spent over an hour and a half with her husband and Connie Jones.  Over half of this time was spent in counseling and coordination of care.  Dr. Dwain Sarna is going to call her with the results of her mammogram.  Certainly, if there is any residual calcifications on the mammogram re-excision would be warranted. I would be happy to see her back if she elects for breast conservation 2- 3 weeks after surgery to begin radiation.  She and her husband were grateful for the information we discussed.    ______________________________ Lurline Hare, M.D. SW/MEDQ  D:  11/22/2011  T:  11/21/2011  Job:  [id number]

## 2011-11-25 ENCOUNTER — Other Ambulatory Visit: Payer: Self-pay | Admitting: Radiation Oncology

## 2011-11-28 ENCOUNTER — Ambulatory Visit
Admission: RE | Admit: 2011-11-28 | Discharge: 2011-11-28 | Disposition: A | Discharge status: Home / Self Care | Payer: BC Managed Care – PPO | Source: Ambulatory Visit | Attending: General Surgery | Admitting: General Surgery

## 2011-11-28 DIAGNOSIS — D0510 Intraductal carcinoma in situ of unspecified breast: Secondary | ICD-10-CM

## 2011-12-23 DIAGNOSIS — Z923 Personal history of irradiation: Secondary | ICD-10-CM

## 2011-12-23 HISTORY — DX: Personal history of irradiation: Z92.3

## 2011-12-30 ENCOUNTER — Telehealth (INDEPENDENT_AMBULATORY_CARE_PROVIDER_SITE_OTHER): Payer: Self-pay

## 2011-12-30 ENCOUNTER — Encounter: Payer: Self-pay | Admitting: *Deleted

## 2011-12-30 NOTE — Telephone Encounter (Signed)
Returned pt's husband v.m. Message. The pt is upset b/c she has not heard back from our office since her mgm was done in December. The pt did meet with Dr Michell Heinrich back in November before her mgm was done but never got her radiation tx's started b/c waiting to hear back from [place]. I apologizd to the husband for the misunderstanding b/c the day the pt got her mgm Dr Judyann Munson from the [facility] did call Dr Dwain Sarna and go over the results but Dr Judyann Munson did say she went over the results with the pt with the pt understanding everything was ok with her mgm report. I told the husband that Dr Dwain Sarna just thought the pt was ok after speaking with Dr Judyann Munson and that the pt would call Dr Garry Heater office back to go ahead with radiation. I told the husband that Dr Dwain Sarna would be glad to see the pt next wk instead of Feb. And for them to go ahead and call DR [facility] office to get the pt set for her radiation tx's. I made an appt for the pt to see Dr Dwain Sarna on 01-08-12. I apologized again for the break down of communication and for the pt having to worry like she was b/c we would definitely been  Back in touch with her if we would of known they were waiting for our call. The husband was ok and he said he would relay all this info to the pt.[name]

## 2011-12-30 NOTE — Progress Notes (Signed)
[facility] Psychosocial Distress Screening Clinical Social Work  Clinical Social Work was referred by distress screening protocol. The patient scored a 9 on the Psychosocial Distress Thermometer which indicates severe distress. Clinical Social Worker spoke with the patient to assess for distress and other psychosocial needs. The patient states she is waiting to hear back from Connie Jones office to determine if it is necessary to complete radiation treatment. Connie Jones expresses feeling relieved that her mammogram looked clear after surgery, but is just anxious to learn what she needs to do next. The patient has requested a follow up appointment with Dr. Dwain Jones. CSW encouraged pt to call with any additional questions or concerns. Connie Jones, MSW, [facility] Clinical Social Worker [facility] [phone]

## 2012-01-01 ENCOUNTER — Ambulatory Visit
Admission: RE | Admit: 2012-01-01 | Discharge: 2012-01-01 | Disposition: A | Discharge status: Home / Self Care | Payer: BC Managed Care – PPO | Source: Ambulatory Visit | Attending: Radiation Oncology | Admitting: Radiation Oncology

## 2012-01-01 DIAGNOSIS — C50119 Malignant neoplasm of central portion of unspecified female breast: Secondary | ICD-10-CM

## 2012-01-01 NOTE — Progress Notes (Signed)
Met with patient to discuss RO billing.  Patient had no concerns today. 

## 2012-01-02 NOTE — Progress Notes (Signed)
Case ref #[id number] - spoke to Ms. Connie Jones and [name], both at [facility], no precert req'd for op radiation treatment.  Patient is eligible and active on policy.

## 2012-01-02 NOTE — Progress Notes (Signed)
Name: Connie Jones   NWG:.[id number]  Date:  12/31/2010  DOB: 03-16-1961  Status:outpatient    DIAGNOSIS: Breast cancer.  CONSENT VERIFIED: yes   SET UP: Patient is setup supine   IMMOBILIZATION:  The following immobilization was used:Custom Moldable Pillow, breast board.   NARRATIVE:The patient was brought to the CT Simulation planning suite.  Identity was confirmed.  All relevant records and images related to the planned course of therapy were reviewed.  Then, the patient was positioned in a stable reproducible clinical set-up for radiation therapy.  Wires were placed to delineate the clinical extent of breast tissue. A wire was placed on her scar as well.  CT images were obtained.  Skin markings were placed.  The CT images were loaded into the planning software where the target and avoidance structures were contoured.  The radiation prescription for tangents and a boost were entered and confirmed. The patient was discharged in stable condition and tolerated simulation well.    TREATMENT PLANNING NOTE:  Treatment planning then occurred. I have requested : MLC's, isodose plan, basic dose calculation

## 2012-01-08 ENCOUNTER — Encounter (INDEPENDENT_AMBULATORY_CARE_PROVIDER_SITE_OTHER): Payer: BC Managed Care – PPO | Admitting: General Surgery

## 2012-01-08 ENCOUNTER — Ambulatory Visit
Admission: RE | Admit: 2012-01-08 | Discharge: 2012-01-08 | Disposition: A | Discharge status: Home / Self Care | Payer: BC Managed Care – PPO | Source: Ambulatory Visit | Attending: Radiation Oncology | Admitting: Radiation Oncology

## 2012-01-08 ENCOUNTER — Encounter: Payer: Self-pay | Admitting: Radiation Oncology

## 2012-01-12 ENCOUNTER — Encounter: Payer: Self-pay | Admitting: Radiation Oncology

## 2012-01-12 ENCOUNTER — Ambulatory Visit
Admission: RE | Admit: 2012-01-12 | Discharge: 2012-01-12 | Disposition: A | Discharge status: Home / Self Care | Payer: BC Managed Care – PPO | Source: Ambulatory Visit | Attending: Radiation Oncology | Admitting: Radiation Oncology

## 2012-01-12 DIAGNOSIS — D0510 Intraductal carcinoma in situ of unspecified breast: Secondary | ICD-10-CM

## 2012-01-12 NOTE — Progress Notes (Signed)
[facility] Radiation Oncology Simulation Verification Note   Name: MAEBELLE SULTON MRN: [id number]   Date: 01/08/2012  DOB: 12-Dec-1961  Status:outpatient   DIAGNOSIS:  1. DCIS (ductal carcinoma in situ) of breast     POSITION: Patient was placed in the supine position on the treatment machine.  Isocenter and MLCS were reviewed and treatment was approved.  NARRATIVE: Patient tolerated simulation well.

## 2012-01-12 NOTE — Progress Notes (Signed)
POST SIM TEACHING DONE WITH PATIENT, VERBALIZES UNDERSTANDING.  PT HAD BEEN GIVEN BOOKLET AND SHEET FOR SKIN CARE ALREADY BY DR. Michell Heinrich.  DISCUSSED IN MORE DETAIL WITH PT AND ASKED HER IF SHE HAD QUESTIONS.  SHE DID HAVE A FEW QUESTIONS AND I WAS ABLE TO ANSWER THOSE TO HER LIKING.  ALSO GAVE RADIAPLEX AND ALRA DEODORANT WITH INSTRUCTIONS FOR USE.

## 2012-01-13 ENCOUNTER — Encounter (INDEPENDENT_AMBULATORY_CARE_PROVIDER_SITE_OTHER): Payer: Self-pay | Admitting: General Surgery

## 2012-01-13 ENCOUNTER — Ambulatory Visit (INDEPENDENT_AMBULATORY_CARE_PROVIDER_SITE_OTHER): Payer: BC Managed Care – PPO | Admitting: General Surgery

## 2012-01-13 ENCOUNTER — Encounter: Payer: Self-pay | Admitting: Radiation Oncology

## 2012-01-13 ENCOUNTER — Ambulatory Visit
Admission: RE | Admit: 2012-01-13 | Discharge: 2012-01-13 | Disposition: A | Discharge status: Home / Self Care | Payer: BC Managed Care – PPO | Source: Ambulatory Visit | Attending: Radiation Oncology | Admitting: Radiation Oncology

## 2012-01-13 VITALS — BP 114/66 | HR 73 | Temp 98.1°F | Ht 67.0 in | Wt 122.4 lb

## 2012-01-13 DIAGNOSIS — D0510 Intraductal carcinoma in situ of unspecified breast: Secondary | ICD-10-CM

## 2012-01-13 DIAGNOSIS — Z853 Personal history of malignant neoplasm of breast: Secondary | ICD-10-CM

## 2012-01-13 NOTE — Progress Notes (Signed)
Weekly Management Note Current Dose:  3.6 Gy  Projected Dose: 61 Gy   Narrative:  The patient presents for routine under treatment assessment.  CBCT/MVCT images/Port film x-rays were reviewed.  The chart was checked. Doing well. No complaints. Wondered about exercise and needs jury duty summons letter.   Physical Findings: Weight: 123 lb 3.2 oz (55.883 kg). Unchanged  Impression:  The patient is tolerating radiation.  Plan:  Continue treatment as planned. Rn education performed.

## 2012-01-13 NOTE — Progress Notes (Signed)
Subjective:     Patient ID: Connie Jones, female   DOB: Oct 25, 1961, 51 y.o.   MRN: [id number]  HPI This is a 80 yof who underwent right breast lumpectomy for DCIS as well as ADH, radial scar and LCIS.  She is now started her radiation therapy with Dr. Michell Heinrich.  A postop MMG showed some benign calcifications that in discussion with radiology were not associated with other area and could be followed.  She comes in today to go over this again.  Review of Systems Clinical Data: The patient had a stereotactic biopsy on 10/09/2011  which demonstrated a radial scar and LCIS. She then underwent an  MRI and an MR guided core biopsy on 10/24/2011 which demonstrated  LCIS and a radial scar. The patient had surgical excision where a  small focus of DCIS was diagnosed. Pre radiation mammogram.  DIGITAL DIAGNOSTIC RIGHT MAMMOGRAM WITH CAD  Comparison: With priors  Findings: There is a dense fibroglandular pattern. Lumpectomy  changes are seen in the right breast. In the upper outer quadrant  of the breast there are dystrophic calcifications. Anterior and  medial to the dystrophic calcifications are three calcifications  that have a coarse appearance and are thought to likely be benign  and only seen on the CC projection. These calcifications are likely  present on the patient's magnification views performed on  10/08/2011 and were located lateral to the concerning  calcifications and radial scar. There is no suspicious mass.  Mammographic images were processed with CAD.  IMPRESSION:  Probable benign calcifications in the right breast. Short-term  interval follow-up right mammogram in 6 months is recommended.  BI-RADS CATEGORY 3: Probably benign finding(s) - short interval  follow-up suggested.     Objective:   Physical Exam    deferred here for discussion Assessment:     DCIS right breast s/p lumpectomy    Plan:     She is now undergoing xrt  She needs six month follow up mmg for  calcifications but might be better to wait six months after xrt, will discuss with radiology Will also refer to medical oncology now also.

## 2012-01-13 NOTE — Patient Instructions (Signed)
Will set up follow up based on next mmg

## 2012-01-13 NOTE — Progress Notes (Signed)
TODAY WAS 2ND TX, NO C/O. ASKS ABOUT EXERCISE, TOLD HER THIS WAS FINE TO CONTINUE HER NORMAL ROUTINE

## 2012-01-14 ENCOUNTER — Ambulatory Visit
Admission: RE | Admit: 2012-01-14 | Discharge: 2012-01-14 | Disposition: A | Discharge status: Home / Self Care | Payer: BC Managed Care – PPO | Source: Ambulatory Visit | Attending: Radiation Oncology | Admitting: Radiation Oncology

## 2012-01-15 ENCOUNTER — Ambulatory Visit
Admission: RE | Admit: 2012-01-15 | Discharge: 2012-01-15 | Disposition: A | Discharge status: Home / Self Care | Payer: BC Managed Care – PPO | Source: Ambulatory Visit | Attending: Radiation Oncology | Admitting: Radiation Oncology

## 2012-01-16 ENCOUNTER — Ambulatory Visit
Admission: RE | Admit: 2012-01-16 | Discharge: 2012-01-16 | Disposition: A | Discharge status: Home / Self Care | Payer: BC Managed Care – PPO | Source: Ambulatory Visit | Attending: Radiation Oncology | Admitting: Radiation Oncology

## 2012-01-19 ENCOUNTER — Ambulatory Visit
Admission: RE | Admit: 2012-01-19 | Discharge: 2012-01-19 | Disposition: A | Discharge status: Home / Self Care | Payer: BC Managed Care – PPO | Source: Ambulatory Visit | Attending: Radiation Oncology | Admitting: Radiation Oncology

## 2012-01-20 ENCOUNTER — Ambulatory Visit
Admission: RE | Admit: 2012-01-20 | Discharge: 2012-01-20 | Disposition: A | Discharge status: Home / Self Care | Payer: BC Managed Care – PPO | Source: Ambulatory Visit | Attending: Radiation Oncology | Admitting: Radiation Oncology

## 2012-01-20 ENCOUNTER — Encounter: Payer: Self-pay | Admitting: Radiation Oncology

## 2012-01-20 DIAGNOSIS — D0510 Intraductal carcinoma in situ of unspecified breast: Secondary | ICD-10-CM

## 2012-01-20 NOTE — Progress Notes (Signed)
Pt has no c/o today. States post sim ed was done. Applying radiaplex bid.

## 2012-01-20 NOTE — Progress Notes (Signed)
Weekly Management Note Current Dose: 12.6  Gy  Projected Dose: 61  Gy   Narrative:  The patient presents for routine under treatment assessment.  CBCT/MVCT images/Port film x-rays were reviewed.  The chart was checked. Doing well. No breast related complaints.   Physical Findings: Weight: 122 lb 14.4 oz (55.747 kg). Unchanged. Skin intact and no erythema.   Impression:  The patient is tolerating radiation.  Plan:  Continue treatment as planned. Continue radiaplex.

## 2012-01-21 ENCOUNTER — Ambulatory Visit
Admission: RE | Admit: 2012-01-21 | Discharge: 2012-01-21 | Disposition: A | Discharge status: Home / Self Care | Payer: BC Managed Care – PPO | Source: Ambulatory Visit | Attending: Radiation Oncology | Admitting: Radiation Oncology

## 2012-01-22 ENCOUNTER — Ambulatory Visit
Admission: RE | Admit: 2012-01-22 | Discharge: 2012-01-22 | Disposition: A | Discharge status: Home / Self Care | Payer: BC Managed Care – PPO | Source: Ambulatory Visit | Attending: Radiation Oncology | Admitting: Radiation Oncology

## 2012-01-23 ENCOUNTER — Ambulatory Visit
Admission: RE | Admit: 2012-01-23 | Discharge: 2012-01-23 | Disposition: A | Discharge status: Home / Self Care | Payer: BC Managed Care – PPO | Source: Ambulatory Visit | Attending: Radiation Oncology | Admitting: Radiation Oncology

## 2012-01-23 ENCOUNTER — Telehealth: Payer: Self-pay | Admitting: *Deleted

## 2012-01-23 NOTE — Telephone Encounter (Signed)
Confirmed 01/27/12 appt w/ pt.  Unable to mail before letter & packet - gave verbal.

## 2012-01-26 ENCOUNTER — Ambulatory Visit
Admission: RE | Admit: 2012-01-26 | Discharge: 2012-01-26 | Disposition: A | Discharge status: Home / Self Care | Payer: BC Managed Care – PPO | Source: Ambulatory Visit | Attending: Radiation Oncology | Admitting: Radiation Oncology

## 2012-01-26 ENCOUNTER — Other Ambulatory Visit: Payer: Self-pay | Admitting: Oncology

## 2012-01-26 ENCOUNTER — Encounter (INDEPENDENT_AMBULATORY_CARE_PROVIDER_SITE_OTHER): Payer: BC Managed Care – PPO | Admitting: General Surgery

## 2012-01-26 DIAGNOSIS — D0510 Intraductal carcinoma in situ of unspecified breast: Secondary | ICD-10-CM

## 2012-01-27 ENCOUNTER — Other Ambulatory Visit: Payer: Self-pay | Admitting: Oncology

## 2012-01-27 ENCOUNTER — Ambulatory Visit
Admission: RE | Admit: 2012-01-27 | Discharge: 2012-01-27 | Disposition: A | Discharge status: Home / Self Care | Payer: BC Managed Care – PPO | Source: Ambulatory Visit | Attending: Radiation Oncology | Admitting: Radiation Oncology

## 2012-01-27 ENCOUNTER — Ambulatory Visit (HOSPITAL_BASED_OUTPATIENT_CLINIC_OR_DEPARTMENT_OTHER): Payer: BC Managed Care – PPO | Admitting: Oncology

## 2012-01-27 ENCOUNTER — Other Ambulatory Visit: Payer: BC Managed Care – PPO | Admitting: Lab

## 2012-01-27 ENCOUNTER — Ambulatory Visit: Payer: BC Managed Care – PPO

## 2012-01-27 DIAGNOSIS — D0590 Unspecified type of carcinoma in situ of unspecified breast: Secondary | ICD-10-CM

## 2012-01-27 DIAGNOSIS — Z170 Estrogen receptor positive status [ER+]: Secondary | ICD-10-CM

## 2012-01-27 DIAGNOSIS — D0510 Intraductal carcinoma in situ of unspecified breast: Secondary | ICD-10-CM

## 2012-01-27 DIAGNOSIS — E559 Vitamin D deficiency, unspecified: Secondary | ICD-10-CM

## 2012-01-27 DIAGNOSIS — D0501 Lobular carcinoma in situ of right breast: Secondary | ICD-10-CM

## 2012-01-27 NOTE — Progress Notes (Signed)
NO C/O, NOTED SKIN IS FAINT PINK AT THIS POINT.  C/O SOME SORENESS BUT NO PAIN AT [facility]

## 2012-01-27 NOTE — Progress Notes (Signed)
Weekly Management Note Current Dose: 21.6  Gy  Projected Dose: 61 Gy   Narrative:  The patient presents for routine under treatment assessment.  CBCT/MVCT images/Port film x-rays were reviewed.  The chart was checked. Doing well. Some irritation. Has appt with Donnie Coffin this afternoon to discuss anti-estrogen therapy.   Physical Findings: Weight: 124 lb 1.6 oz (56.291 kg). Slightly pink breast.  Impression:  The patient is tolerating radiation.  Plan:  Continue treatment as planned. Continue radiaplex.

## 2012-01-27 NOTE — Patient Instructions (Addendum)
Peridin C is for hot flashes and is available over the counter, and can be taken 2-3 times per day. Vitamin D3 1000 units should be taken daily  Tamoxifen will be prescribed and be available at the your pharmacy.. It should be started after you finish radiation.           Tamoxifen oral tablet What is this medicine? TAMOXIFEN (ta MOX i fen) blocks the effects of estrogen. It is commonly used to treat breast cancer. It is also used to decrease the chance of breast cancer coming back in women who have received treatment for the disease. It may also help prevent breast cancer in women who have a high risk of developing breast cancer. This medicine may be used for other purposes; ask your health care provider or pharmacist if you have questions. What should I tell my health care provider before I take this medicine? They need to know if you have any of these conditions: -blood clots -blood disease -cataracts or impaired eyesight -endometriosis -high calcium levels -high cholesterol -irregular menstrual cycles -liver disease -stroke -uterine fibroids -an unusual or allergic reaction to tamoxifen, other medicines, foods, dyes, or preservatives -pregnant or trying to get pregnant -breast-feeding How should I use this medicine? Take this medicine by mouth with a glass of water. Follow the directions on the prescription label. You can take it with or without food. Take your medicine at regular intervals. Do not take your medicine more often than directed. Do not stop taking except on your doctor's advice. A special MedGuide will be given to you by the pharmacist with each prescription and refill. Be sure to read this information carefully each time. Talk to your pediatrician regarding the use of this medicine in children. While this drug may be prescribed for selected conditions, precautions do apply. Overdosage: If you think you have taken too much of this medicine contact a poison  control center or emergency room at once. NOTE: This medicine is only for you. Do not share this medicine with others. What if I miss a dose? If you miss a dose, take it as soon as you can. If it is almost time for your next dose, take only that dose. Do not take double or extra doses. What may interact with this medicine? -aminoglutethimide -bromocriptine -chemotherapy drugs -female hormones, like estrogens and birth control pills -letrozole -medroxyprogesterone -phenobarbital -rifampin -warfarin This list may not describe all possible interactions. Give your health care provider a list of all the medicines, herbs, non-prescription drugs, or dietary supplements you use. Also tell them if you smoke, drink alcohol, or use illegal drugs. Some items may interact with your medicine. What should I watch for while using this medicine? Visit your doctor or health care professional for regular checks on your progress. You will need regular pelvic exams, breast exams, and mammograms. If you are taking this medicine to reduce your risk of getting breast cancer, you should know that this medicine does not prevent all types of breast cancer. If breast cancer or other problems occur, there is no guarantee that it will be found at an early stage. Do not become pregnant while taking this medicine or for 2 months after stopping this medicine. Stop taking this medicine if you get pregnant or think you are pregnant and contact your doctor. This medicine may harm your unborn baby. Women who can possibly become pregnant should use birth control methods that do not use hormones during tamoxifen treatment and for 2 months after  therapy has stopped. Talk with your health care provider for birth control advice. Do not breast feed while taking this medicine. What side effects may I notice from receiving this medicine? Side effects that you should report to your doctor or health care professional as soon as  possible: -changes in vision (blurred vision) -changes in your menstrual cycle -difficulty breathing or shortness of breath -difficulty walking or talking -new breast lumps -numbness -pelvic pain or pressure -redness, blistering, peeling or loosening of the skin, including inside the mouth -skin rash or itching (hives) -sudden chest pain -swelling of lips, face, or tongue -swelling, pain or tenderness in your calf or leg -unusual bruising or bleeding -vaginal discharge that is bloody, brown, or rust -weakness -yellowing of the whites of the eyes or skin Side effects that usually do not require medical attention (report to your doctor or health care professional if they continue or are bothersome): -fatigue -hair loss, although uncommon and is usually mild -headache -hot flashes -impotence (in men) -nausea, vomiting (mild) -vaginal discharge (white or clear) This list may not describe all possible side effects. Call your doctor for medical advice about side effects. You may report side effects to FDA at 1-800-FDA-1088. Where should I keep my medicine? Keep out of the reach of children. Store at room temperature between 20 and 25 degrees C (68 and 77 degrees F). Protect from light. Keep container tightly closed. Throw away any unused medicine after the expiration date. NOTE: This sheet is a summary. It may not cover all possible information. If you have questions about this medicine, talk to your doctor, pharmacist, or health care provider.  2012, [employer]. (08/24/2008 12:01:56 PM)

## 2012-01-28 ENCOUNTER — Ambulatory Visit
Admission: RE | Admit: 2012-01-28 | Discharge: 2012-01-28 | Disposition: A | Discharge status: Home / Self Care | Payer: BC Managed Care – PPO | Source: Ambulatory Visit | Attending: Radiation Oncology | Admitting: Radiation Oncology

## 2012-01-29 ENCOUNTER — Ambulatory Visit
Admission: RE | Admit: 2012-01-29 | Discharge: 2012-01-29 | Disposition: A | Discharge status: Home / Self Care | Payer: BC Managed Care – PPO | Source: Ambulatory Visit | Attending: Radiation Oncology | Admitting: Radiation Oncology

## 2012-01-29 NOTE — Progress Notes (Signed)
Connie Jones is an 51 y.o. female.      Chief Complaint  Patient presents with  . Breast Cancer    HPI: The pt underwent a screening mammogram in October 2012. An abnormality was detected in the right breast, saturations were seen in the lateral aspect of the breast with the 12:00 position. The biopsy performed showed ADH and LCI. rising in a radial scar. MRI scan of bilateral breasts on 10/17/2011 showed area of non-masslike enhancement measuring 1.9 x 1.5 cm . In addition an area measuring 2.4 x 1.6 x 1.6 cm of non-masslike enhancement was seen in the upper-outer quadrant of the right breast.  an MR guided biopsy was recommended .this biopsy was performed 10/24/2011 and showed a complex sclerosing lesion with atypical lobular hyperplasia and some benign calcifications. Ectomy performed on 11/06/2011 showed focal low-grade DCIS in the background of complex sclerosing lesion resection margins were negative. No invasion was seen. CIS was ER and PR positive at 82 and 72% respectively.. The patient has been seen by Dr. Michell Heinrich started radiation therapy on 11/23/2012.  Past Medical History  Diagnosis Date  . Arthritis     mild in knees  . Breast cancer     Past Surgical History  Procedure Date  . Cesarean section   . Laparoscopy     Infertility studies  . Breast biopsy 11/05/2011    Procedure: BREAST BIOPSY WITH NEEDLE LOCALIZATION;  Surgeon: Emelia Loron, MD;  Location: [facility];  Service: General;  Laterality: Right;  needle localization times two at [facility] 12:30    Family History  Problem Relation Age of Onset  . Colon polyps Father   . Colon cancer Paternal Grandfather   . Anesthesia problems Sister   1 brother in good health No hx of breast or ovarian cancer.  Gynecologic history: G2P2 Menarche 13 Menopause 2-3 y ago, HRT x 2 y Some hot flashes recently.  Social History:  reports that she has never smoked. She has never used smokeless tobacco. She  reports that she drinks alcohol. She reports that she does not use illicit drugs. She has been married for 69 y, with 2 children 18 and 12. Her husband works for [employer].  Health maintenance:  Cholesterol yes  Bone density yes  Colonoscopy yes  (PSA) n/a  (PAP) yes   Allergies: No Known Allergies  Medications Prior to Admission  Medication Sig Dispense Refill  . calcium citrate-vitamin D 200-200 MG-UNIT TABS Take 2 tablets by mouth 2 (two) times daily.        Marland Kitchen glucosamine-chondroitin 500-400 MG tablet Take 3 tablets by mouth daily.        . Multiple Vitamins-Minerals (MULTIVITAMIN WITH MINERALS) tablet Take 1 tablet by mouth daily.         No current facility-administered medications on file as of 01/27/2012.    ROS 14 point ROS was negative  Physical Exam:  Blood pressure 114/73, pulse 78, temperature 98.7 F (37.1 C), height 5' 6.5" (1.689 m), weight 125 lb 3.2 oz (56.79 kg).  Sclerae unicteric Oropharynx clear no thrush No peripheral adenopathy Lungs no rales or rhonchi Breasts: no abnormailities, scar healing well, mild xrt related erythema Heart regular rate and rhythm Abd benign, no organomegaly MSK no focal spinal tenderness, no peripheral edema Neuro: nonfocal :   CBC Lab Results  Component Value Date   WBC 4.5 11/04/2011   HGB 15.4* 11/05/2011   HCT 41.3 11/04/2011   MCV 93.7 11/04/2011   PLT  191 11/04/2011   CMP     Component Value Date/Time   NA 139 11/04/2011 1300   K 4.4 11/04/2011 1300   CL 102 11/04/2011 1300   CO2 29 11/04/2011 1300   GLUCOSE 100* 11/04/2011 1300   BUN 11 11/04/2011 1300   CREATININE 0.72 11/04/2011 1300   CALCIUM 10.0 11/04/2011 1300   GFRNONAA >90 11/04/2011 1300   GFRAA >90 11/04/2011 1300     No results found.   Assessment /Plan:  The patient is a pleasant 51 year old menopausal woman who presents with a small focus of DCIS, that is ER/PR positive. She is currently on radiation therapy. I have discussed with her  the use of adjuvant tamoxifen therapy. I have discussed the rationale for this as well as side effects. As indicated the low risk of possible blood clots and/or carcinoma of the uterus. I have given her a prescription for this and will recommend that I see her in 3 months time.    Dyneisha Murchison MD 01/29/2012, 6:13 AM

## 2012-01-30 ENCOUNTER — Ambulatory Visit
Admission: RE | Admit: 2012-01-30 | Discharge: 2012-01-30 | Disposition: A | Discharge status: Home / Self Care | Payer: BC Managed Care – PPO | Source: Ambulatory Visit | Attending: Radiation Oncology | Admitting: Radiation Oncology

## 2012-01-30 ENCOUNTER — Encounter: Payer: Self-pay | Admitting: *Deleted

## 2012-01-30 NOTE — Progress Notes (Signed)
Mailed after appt letter to pt. 

## 2012-02-02 ENCOUNTER — Ambulatory Visit
Admission: RE | Admit: 2012-02-02 | Discharge: 2012-02-02 | Disposition: A | Discharge status: Home / Self Care | Payer: BC Managed Care – PPO | Source: Ambulatory Visit | Attending: Radiation Oncology | Admitting: Radiation Oncology

## 2012-02-03 ENCOUNTER — Ambulatory Visit
Admission: RE | Admit: 2012-02-03 | Discharge: 2012-02-03 | Disposition: A | Discharge status: Home / Self Care | Payer: BC Managed Care – PPO | Source: Ambulatory Visit | Attending: Radiation Oncology | Admitting: Radiation Oncology

## 2012-02-03 ENCOUNTER — Other Ambulatory Visit: Payer: Self-pay | Admitting: Oncology

## 2012-02-03 DIAGNOSIS — D0510 Intraductal carcinoma in situ of unspecified breast: Secondary | ICD-10-CM

## 2012-02-03 NOTE — Progress Notes (Signed)
Weekly Management Note Current Dose:   Gy  Projected Dose:  Gy   Narrative:  The patient presents for routine under treatment assessment.  CBCT/MVCT images/Port film x-rays were reviewed.  The chart was checked. Doing well. Had some questions about supplement recommendations from Dr. Donnie Coffin.   Physical Findings: Weight: 123 lb 12.8 oz (56.155 kg). Some dermatitis medially.   Impression:  The patient is tolerating radiation.  Plan:  Continue treatment as planned. Encouraged her to call Dr. Renelda Loma nurse regarding questions. Continue radiaplex.

## 2012-02-03 NOTE — Progress Notes (Signed)
Here for routine weekly md visit for right breast ca. Has completed 17of 33 treatments. Has mild follicular rash of left chest wall.Pt. Using hydrocortisone 1% cream for itching. Mild fatigue with increased activity.

## 2012-02-04 ENCOUNTER — Ambulatory Visit
Admission: RE | Admit: 2012-02-04 | Discharge: 2012-02-04 | Disposition: A | Discharge status: Home / Self Care | Payer: BC Managed Care – PPO | Source: Ambulatory Visit | Attending: Radiation Oncology | Admitting: Radiation Oncology

## 2012-02-05 ENCOUNTER — Ambulatory Visit
Admission: RE | Admit: 2012-02-05 | Discharge: 2012-02-05 | Disposition: A | Discharge status: Home / Self Care | Payer: BC Managed Care – PPO | Source: Ambulatory Visit | Attending: Radiation Oncology | Admitting: Radiation Oncology

## 2012-02-06 ENCOUNTER — Ambulatory Visit
Admission: RE | Admit: 2012-02-06 | Discharge: 2012-02-06 | Disposition: A | Discharge status: Home / Self Care | Payer: BC Managed Care – PPO | Source: Ambulatory Visit | Attending: Radiation Oncology | Admitting: Radiation Oncology

## 2012-02-09 ENCOUNTER — Ambulatory Visit
Admission: RE | Admit: 2012-02-09 | Discharge: 2012-02-09 | Disposition: A | Discharge status: Home / Self Care | Payer: BC Managed Care – PPO | Source: Ambulatory Visit | Attending: Radiation Oncology | Admitting: Radiation Oncology

## 2012-02-10 ENCOUNTER — Ambulatory Visit
Admission: RE | Admit: 2012-02-10 | Discharge: 2012-02-10 | Disposition: A | Discharge status: Home / Self Care | Payer: BC Managed Care – PPO | Source: Ambulatory Visit | Attending: Radiation Oncology | Admitting: Radiation Oncology

## 2012-02-10 DIAGNOSIS — D0501 Lobular carcinoma in situ of right breast: Secondary | ICD-10-CM

## 2012-02-10 NOTE — Progress Notes (Signed)
Noted small area of radiation dermatitis, says it is itching some but she uses hydrocortisone 1% and that helps

## 2012-02-10 NOTE — Progress Notes (Signed)
Weekly Management Note Current Dose: 39.6 Gy  Projected Dose:  61 Gy   Narrative:  The patient presents for routine under treatment assessment.  CBCT/MVCT images/Port film x-rays were reviewed.  The chart was checked. Using hydrocortisone with good relief. Needs script for tam   Physical Findings: Dermatitis medially.   Impression:  The patient is tolerating radiation.  Plan:  Continue treatment as planned. Continue hydrocortisone. Will contact med onc for tam script.

## 2012-02-11 ENCOUNTER — Ambulatory Visit
Admission: RE | Admit: 2012-02-11 | Discharge: 2012-02-11 | Disposition: A | Discharge status: Home / Self Care | Payer: BC Managed Care – PPO | Source: Ambulatory Visit | Attending: Radiation Oncology | Admitting: Radiation Oncology

## 2012-02-11 ENCOUNTER — Encounter: Payer: Self-pay | Admitting: Radiation Oncology

## 2012-02-11 NOTE — Progress Notes (Signed)
Name: Connie Jones   MRN: [id number]  Date:  02/11/2012   DOB: Jun 22, 1961  Status:outpatient    DIAGNOSIS: Breast cancer.  CONSENT VERIFIED: yes   SET UP: Patient is setup supine   IMMOBILIZATION:  The following immobilization was used:Custom Moldable Pillow, breast board.   NARRATIVE: [name] underwent complex simulation and treatment planning for her boost treatment today.  Her tumor volume was outlined on the planning CT scan. The dept of her cavity was 2.6 Cm.    9  MeV electrons will be prescribed to the 90% Isodose line.   A block will be used for beam modification purposes.  A special port plan is requested.

## 2012-02-12 ENCOUNTER — Ambulatory Visit
Admission: RE | Admit: 2012-02-12 | Discharge: 2012-02-12 | Disposition: A | Discharge status: Home / Self Care | Payer: BC Managed Care – PPO | Source: Ambulatory Visit | Attending: Radiation Oncology | Admitting: Radiation Oncology

## 2012-02-13 ENCOUNTER — Ambulatory Visit
Admission: RE | Admit: 2012-02-13 | Discharge: 2012-02-13 | Disposition: A | Discharge status: Home / Self Care | Payer: BC Managed Care – PPO | Source: Ambulatory Visit | Attending: Radiation Oncology | Admitting: Radiation Oncology

## 2012-02-16 ENCOUNTER — Ambulatory Visit
Admission: RE | Admit: 2012-02-16 | Discharge: 2012-02-16 | Disposition: A | Discharge status: Home / Self Care | Payer: BC Managed Care – PPO | Source: Ambulatory Visit | Attending: Radiation Oncology | Admitting: Radiation Oncology

## 2012-02-17 ENCOUNTER — Ambulatory Visit
Admission: RE | Admit: 2012-02-17 | Discharge: 2012-02-17 | Disposition: A | Discharge status: Home / Self Care | Payer: BC Managed Care – PPO | Source: Ambulatory Visit | Attending: Radiation Oncology | Admitting: Radiation Oncology

## 2012-02-17 ENCOUNTER — Other Ambulatory Visit: Payer: Self-pay | Admitting: Radiation Oncology

## 2012-02-17 DIAGNOSIS — D0510 Intraductal carcinoma in situ of unspecified breast: Secondary | ICD-10-CM

## 2012-02-17 DIAGNOSIS — D0590 Unspecified type of carcinoma in situ of unspecified breast: Secondary | ICD-10-CM | POA: Insufficient documentation

## 2012-02-17 DIAGNOSIS — D0501 Lobular carcinoma in situ of right breast: Secondary | ICD-10-CM

## 2012-02-17 DIAGNOSIS — Y842 Radiological procedure and radiotherapy as the cause of abnormal reaction of the patient, or of later complication, without mention of misadventure at the time of the procedure: Secondary | ICD-10-CM | POA: Insufficient documentation

## 2012-02-17 MED ORDER — TAMOXIFEN CITRATE 20 MG PO TABS: 20.0000 mg | ORAL_TABLET | Freq: Every day | ORAL | Status: DC

## 2012-02-17 NOTE — Progress Notes (Signed)
NOTED BRIGHT RED AREA AT TOP OF BREAST WITH WHAT APPEARS TO BE RADIATION DERMATITIS.  IT IS ITCHING, USING RADIAPLEX AND CORTISONE 1% AND IT HELPS SOME.  NO OTHER C/O

## 2012-02-17 NOTE — Progress Notes (Signed)
Weekly Management Note Current Dose: 49  Gy  Projected Dose: 61 Gy   Narrative:  The patient presents for routine under treatment assessment.  CBCT/MVCT images/Port film x-rays were reviewed.  The chart was checked. Doing well. C/o itching medially. Relieved with cortisone.  Still does not have tamoxifen prescription at pharmacy.   Physical Findings: dermatitis medially. Rest of breast is pink.   Impression:  The patient is tolerating radiation.  Plan:  Continue treatment as planned. Continue cortisone. I printed a prescription for tamoxifen for her although the records seem to indicate Dr. Donnie Coffin e-prescribed this on 2/12. She will continue radiaplex x 2 weeks then lotion with vit e x 2 weeks. See me in 1 month.

## 2012-02-18 ENCOUNTER — Ambulatory Visit
Admission: RE | Admit: 2012-02-18 | Discharge: 2012-02-18 | Disposition: A | Discharge status: Home / Self Care | Payer: BC Managed Care – PPO | Source: Ambulatory Visit | Attending: Radiation Oncology | Admitting: Radiation Oncology

## 2012-02-18 MED ORDER — RADIAPLEXRX EX GEL
Freq: Once | CUTANEOUS | Status: AC
  Administered 2012-02-17: 1 via TOPICAL

## 2012-02-18 NOTE — Progress Notes (Signed)
Encounter addended by: Amanda Pea, RN on: 02/18/2012 11:30 AM<BR>     Documentation filed: Inpatient MAR, Orders

## 2012-02-19 ENCOUNTER — Ambulatory Visit
Admission: RE | Admit: 2012-02-19 | Discharge: 2012-02-19 | Disposition: A | Discharge status: Home / Self Care | Payer: BC Managed Care – PPO | Source: Ambulatory Visit | Attending: Radiation Oncology | Admitting: Radiation Oncology

## 2012-02-20 ENCOUNTER — Ambulatory Visit
Admission: RE | Admit: 2012-02-20 | Discharge: 2012-02-20 | Disposition: A | Discharge status: Home / Self Care | Payer: BC Managed Care – PPO | Source: Ambulatory Visit | Attending: Radiation Oncology | Admitting: Radiation Oncology

## 2012-02-23 ENCOUNTER — Ambulatory Visit
Admission: RE | Admit: 2012-02-23 | Discharge: 2012-02-23 | Disposition: A | Discharge status: Home / Self Care | Payer: BC Managed Care – PPO | Source: Ambulatory Visit | Attending: Radiation Oncology | Admitting: Radiation Oncology

## 2012-02-24 ENCOUNTER — Encounter: Payer: Self-pay | Admitting: Radiation Oncology

## 2012-02-24 ENCOUNTER — Ambulatory Visit
Admission: RE | Admit: 2012-02-24 | Discharge: 2012-02-24 | Disposition: A | Discharge status: Home / Self Care | Payer: BC Managed Care – PPO | Source: Ambulatory Visit | Attending: Radiation Oncology | Admitting: Radiation Oncology

## 2012-02-24 VITALS — Wt 123.9 lb

## 2012-02-24 DIAGNOSIS — D0510 Intraductal carcinoma in situ of unspecified breast: Secondary | ICD-10-CM

## 2012-02-24 NOTE — Progress Notes (Signed)
NO C/O TODAY...FINAL TX TOMORROW.  WILL GIVE APPOINTMENT CARD

## 2012-02-24 NOTE — Progress Notes (Signed)
DIAGNOSIS:  Right breast cancer.  NARRATIVE:  Connie Jones is seen today for weekly assessment.  She has completed 5900 cGy of a planned 6100 cGy directed at the right breast area.  The patient has had some pruritus within the breast, but over the past few days this issue has improved.  She has minimal fatigue at this time.  PHYSICAL EXAMINATION:  Lungs:  Clear.  Heart:  Has a regular rhythm and rate.  Breasts:  Examination of the right breast reveals some erythema and hyperpigmentation changes, as well as dry desquamation, but no moist desquamation.  IMPRESSION AND PLAN:  The patient is tolerating her treatments reasonably well except for issues as above.  The patient's radiation fields are setting up accurately.  The patient's radiation chart was checked today.  Plan is to continue with 1 treatment tomorrow to complete her therapy.  The patient will then be seen in followup by Dr. Michell Heinrich in approximately 4 weeks.    ______________________________ Billie Lade, Ph.D., M.D. JDK/MEDQ  D:  02/24/2012  T:  02/24/2012  Job:  [id number]

## 2012-02-25 ENCOUNTER — Ambulatory Visit
Admission: RE | Admit: 2012-02-25 | Discharge: 2012-02-25 | Disposition: A | Discharge status: Home / Self Care | Payer: BC Managed Care – PPO | Source: Ambulatory Visit | Attending: Radiation Oncology | Admitting: Radiation Oncology

## 2012-03-18 ENCOUNTER — Encounter: Payer: Self-pay | Admitting: Radiation Oncology

## 2012-03-18 NOTE — Progress Notes (Signed)
  Radiation Oncology         [phone] ________________________________  Name: Connie Jones MRN: [id number]  Date: 03/18/2012  DOB: 12/06/1961  End of Treatment Note  Diagnosis:   DCIS of  Right breast    Indication for treatment:  Curative       Radiation treatment dates:   01/12/12-02/25/12  Site/dose:    1) Right Breast / 45 Gray @ 1.8 Gy/fraction  2) Right Breast Boost / 16 Gy @ 2 Gy/fraction  Beams/energy:    1) Opposed tangents with reduced fields / 6 MV 2) En face electrons /  Narrative: The patient tolerated radiation treatment relatively well.   [name] did well through her course of treatment. She had minimal skin irritation which was managed with radiaplex.   Plan: The patient has completed radiation treatment. The patient will return to radiation oncology clinic for routine followup in one month. I advised her to call or return sooner if they have any questions or concerns related to their recovery or treatment. ________________________________

## 2012-03-25 ENCOUNTER — Ambulatory Visit: Payer: BC Managed Care – PPO | Admitting: Radiation Oncology

## 2012-04-01 ENCOUNTER — Other Ambulatory Visit: Payer: Self-pay

## 2012-04-01 ENCOUNTER — Ambulatory Visit
Admission: RE | Admit: 2012-04-01 | Discharge: 2012-04-01 | Disposition: A | Discharge status: Home / Self Care | Payer: BC Managed Care – PPO | Source: Ambulatory Visit | Attending: Radiation Oncology | Admitting: Radiation Oncology

## 2012-04-01 ENCOUNTER — Telehealth: Payer: Self-pay

## 2012-04-01 VITALS — BP 110/62 | HR 74 | Temp 99.5°F | Wt 124.4 lb

## 2012-04-01 DIAGNOSIS — D0510 Intraductal carcinoma in situ of unspecified breast: Secondary | ICD-10-CM

## 2012-04-01 MED ORDER — TAMOXIFEN CITRATE 20 MG PO TABS: 20.0000 mg | ORAL_TABLET | Freq: Every day | ORAL | Status: DC

## 2012-04-01 NOTE — Progress Notes (Signed)
  Radiation Oncology         [phone] ________________________________  Name: Connie Jones MRN: [id number]  Date: 04/01/2012  DOB: [birth year]  Follow-Up Visit Note  CC: [name], MD, MD  [name], MD  Diagnosis:   DCIS of the right breast  Interval Since Last Radiation:  1 months  Narrative:  The patient returns today for routine follow-up.  She has healed up well. She is tolerating tamoxifen without any side effects. She is scheduled to see Dr. Donnie Coffin in a few weeks.                              ALLERGIES:   has no known allergies.  Meds: Current Outpatient Prescriptions  Medication Sig Dispense Refill  . calcium citrate-vitamin D 200-200 MG-UNIT TABS Take 2 tablets by mouth 2 (two) times daily.        Marland Kitchen glucosamine-chondroitin 500-400 MG tablet Take 3 tablets by mouth daily.        . Multiple Vitamins-Minerals (MULTIVITAMIN WITH MINERALS) tablet Take 1 tablet by mouth daily.        . tamoxifen (NOLVADEX) 20 MG tablet Take 1 tablet (20 mg total) by mouth daily.  30 tablet  0    Physical Findings: The patient is in no acute distress. Patient is alert and oriented. . her skin looks great. She has an excellent cosmetic result.  weight is 124 lb 6.4 oz (56.427 kg). Her temperature is 99.5 F (37.5 C). Her blood pressure is 110/62 and her pulse is 74. .  No significant changes.  Lab Findings: Lab Results  Component Value Date   WBC 4.5 11/04/2011   HGB 15.4* 11/05/2011   HCT 41.3 11/04/2011   MCV 93.7 11/04/2011   PLT 191 11/04/2011      Radiographic Findings: No results found.  Impression:  The patient is recovering from the effects of radiation.    Plan:  She looks great. I'll see her back in 6 months. She will continue on her tamoxifen. We can't get today and ask them to fax over a refill request to Dr. Donnie Coffin for tamoxifen.   _____________________________________

## 2012-04-01 NOTE — Telephone Encounter (Signed)
Spoke with Toniann Fail at [facility] to forward refill request for tamoxifen to Dr.Rubin's office as today is last day of 30 day supply written by Dr.Wentworth.Patient informed.Voice mail left for med/onc to inform of rx refill.

## 2012-04-01 NOTE — Progress Notes (Signed)
Here for routine follow post radiation treatment of right breast.  Patient states skin has healed completely. Needs refill on tamoxifen. Denies pain and hasn't noticed any changes or side effects since starting tamoxifen.

## 2012-04-02 ENCOUNTER — Other Ambulatory Visit: Payer: Self-pay | Admitting: *Deleted

## 2012-04-26 ENCOUNTER — Other Ambulatory Visit: Payer: Self-pay | Admitting: Obstetrics & Gynecology

## 2012-04-26 DIAGNOSIS — Z853 Personal history of malignant neoplasm of breast: Secondary | ICD-10-CM

## 2012-05-05 ENCOUNTER — Other Ambulatory Visit: Payer: Self-pay | Admitting: *Deleted

## 2012-05-05 DIAGNOSIS — D0510 Intraductal carcinoma in situ of unspecified breast: Secondary | ICD-10-CM

## 2012-05-05 MED ORDER — TAMOXIFEN CITRATE 20 MG PO TABS: 20.0000 mg | ORAL_TABLET | Freq: Every day | ORAL | Status: DC

## 2012-05-19 ENCOUNTER — Other Ambulatory Visit: Payer: BC Managed Care – PPO | Admitting: Lab

## 2012-05-26 ENCOUNTER — Ambulatory Visit: Payer: BC Managed Care – PPO | Admitting: Oncology

## 2012-06-01 ENCOUNTER — Other Ambulatory Visit (HOSPITAL_BASED_OUTPATIENT_CLINIC_OR_DEPARTMENT_OTHER): Payer: BC Managed Care – PPO | Admitting: Lab

## 2012-06-01 DIAGNOSIS — D0590 Unspecified type of carcinoma in situ of unspecified breast: Secondary | ICD-10-CM

## 2012-06-01 DIAGNOSIS — D0501 Lobular carcinoma in situ of right breast: Secondary | ICD-10-CM

## 2012-06-01 DIAGNOSIS — E559 Vitamin D deficiency, unspecified: Secondary | ICD-10-CM

## 2012-06-01 DIAGNOSIS — D0510 Intraductal carcinoma in situ of unspecified breast: Secondary | ICD-10-CM

## 2012-06-01 LAB — COMPREHENSIVE METABOLIC PANEL
AST: 20 U/L (ref 0–37)
Albumin: 3.8 g/dL (ref 3.5–5.2)
Alkaline Phosphatase: 49 U/L (ref 39–117)
Potassium: 4 mEq/L (ref 3.5–5.3)
Sodium: 140 mEq/L (ref 135–145)
Total Bilirubin: 0.2 mg/dL — ABNORMAL LOW (ref 0.3–1.2)
Total Protein: 6.5 g/dL (ref 6.0–8.3)

## 2012-06-01 LAB — CBC WITH DIFFERENTIAL/PLATELET
EOS%: 1.2 % (ref 0.0–7.0)
LYMPH%: 14.2 % (ref 14.0–49.7)
MCH: 32 pg (ref 25.1–34.0)
MCHC: 33.4 g/dL (ref 31.5–36.0)
MCV: 95.7 fL (ref 79.5–101.0)
MONO%: 7.8 % (ref 0.0–14.0)
RBC: 4.04 10*6/uL (ref 3.70–5.45)
RDW: 12.8 % (ref 11.2–14.5)

## 2012-06-08 ENCOUNTER — Ambulatory Visit (HOSPITAL_BASED_OUTPATIENT_CLINIC_OR_DEPARTMENT_OTHER): Payer: BC Managed Care – PPO | Admitting: Oncology

## 2012-06-08 VITALS — BP 99/56 | HR 83 | Temp 99.1°F | Ht 66.5 in | Wt 124.9 lb

## 2012-06-08 DIAGNOSIS — D0590 Unspecified type of carcinoma in situ of unspecified breast: Secondary | ICD-10-CM

## 2012-06-08 DIAGNOSIS — D0510 Intraductal carcinoma in situ of unspecified breast: Secondary | ICD-10-CM

## 2012-06-08 DIAGNOSIS — C50919 Malignant neoplasm of unspecified site of unspecified female breast: Secondary | ICD-10-CM

## 2012-06-08 NOTE — Progress Notes (Signed)
Hematology and Oncology Follow Up Visit  Connie Jones [id number] 1961-08-20 51 y.o. 06/08/2012 1:15 PM   DIAGNOSIS: History of DCIS status post resection 10/26/2011, status post completion of radiation March 2013. All currently on tamoxifen adjuvantly.  No diagnosis found.   PAST THERAPY: As above   Interim History:  Patient returns for followup. She is doing well. She tolerates tamoxifen well. She is amenorrheic. Has minimal hot flashes but is taking patency. Appetite good weight is stable. Denies headaches blurred vision  Medications: I have reviewed the patient's current medications.  Allergies: No Known Allergies  Past Medical History, Surgical history, Social history, and Family History were reviewed and updated.  Review of Systems: Constitutional:  Negative for fever, chills, night sweats, anorexia, weight loss, pain. Cardiovascular: no chest pain or dyspnea on exertion Respiratory: negative Neurological: negative Dermatological: negative ENT: negative Skin Gastrointestinal: negative Genito-Urinary: negative Hematological and Lymphatic: negative Breast: negative Musculoskeletal: negative Remaining ROS negative.  Physical Exam:  Blood pressure 99/56, pulse 83, temperature 99.1 F (37.3 C), height 5' 6.5" (1.689 m), weight 124 lb 14.4 oz (56.654 kg).  ECOG:      HEENT:  Sclerae anicteric, conjunctivae pink.  Oropharynx clear.  No mucositis or candidiasis.  Nodes:  No cervical, supraclavicular, or axillary lymphadenopathy palpated.  Breast Exam:  Right breast is benign.  No masses, discharge, skin change, or nipple inversion.  Left breast is benign.  No masses, discharge, skin change, or nipple inversion..  Lungs:  Clear to auscultation bilaterally.  No crackles, rhonchi, or wheezes.  Heart:  Regular rate and rhythm.  Abdomen:  Soft, nontender.  Positive bowel sounds.  No organomegaly or masses palpated.  Musculoskeletal:  No focal spinal tenderness to palpation.   Extremities:  Benign.  No peripheral edema or cyanosis.  Skin:  Benign.  Neuro:  Nonfocal.  Lab Results: Lab Results  Component Value Date   WBC 6.1 06/01/2012   HGB 12.9 06/01/2012   HCT 38.6 06/01/2012   MCV 95.7 06/01/2012   PLT 178 06/01/2012     Chemistry      Component Value Date/Time   NA 140 06/01/2012 1559   K 4.0 06/01/2012 1559   CL 104 06/01/2012 1559   CO2 27 06/01/2012 1559   BUN 13 06/01/2012 1559   CREATININE 0.75 06/01/2012 1559      Component Value Date/Time   CALCIUM 8.9 06/01/2012 1559   ALKPHOS 49 06/01/2012 1559   AST 20 06/01/2012 1559   ALT 22 06/01/2012 1559   BILITOT 0.2* 06/01/2012 1559       Radiological Studies:  No results found.   IMPRESSIONS AND PLAN: A 51 y.o. female with   History of ER/PR positive DCIS status post lumpectomy, radiation currently on tamoxifen. Hot flashes aren't issues I recommend should take additional peredign C. She is only taking any medications for the time being. I will see her back in followup in 6 months. She will have appropriate imaging studies which I believe has been scheduled.   Spent more than half the time coordinating care.    Connie Jones 6/18/20131:15 PM

## 2012-09-28 ENCOUNTER — Telehealth: Payer: Self-pay | Admitting: *Deleted

## 2012-09-28 NOTE — Telephone Encounter (Signed)
CALLED PATIENT TO INFORM OF APPT. CHANGE TO 10-08-12, LVM FOR A RETURN CALL

## 2012-09-30 ENCOUNTER — Ambulatory Visit: Payer: BC Managed Care – PPO | Admitting: Radiation Oncology

## 2012-10-04 ENCOUNTER — Ambulatory Visit
Admission: RE | Admit: 2012-10-04 | Discharge: 2012-10-04 | Disposition: A | Discharge status: Home / Self Care | Payer: BC Managed Care – PPO | Source: Ambulatory Visit | Attending: Obstetrics & Gynecology | Admitting: Obstetrics & Gynecology

## 2012-10-04 DIAGNOSIS — Z853 Personal history of malignant neoplasm of breast: Secondary | ICD-10-CM

## 2012-10-08 ENCOUNTER — Ambulatory Visit
Admission: RE | Admit: 2012-10-08 | Discharge: 2012-10-08 | Disposition: A | Discharge status: Home / Self Care | Payer: BC Managed Care – PPO | Source: Ambulatory Visit | Attending: Radiation Oncology | Admitting: Radiation Oncology

## 2012-10-08 ENCOUNTER — Encounter: Payer: Self-pay | Admitting: Radiation Oncology

## 2012-10-08 VITALS — BP 100/66 | HR 67 | Temp 98.5°F | Resp 18 | Wt 128.9 lb

## 2012-10-08 DIAGNOSIS — D0510 Intraductal carcinoma in situ of unspecified breast: Secondary | ICD-10-CM

## 2012-10-08 NOTE — Progress Notes (Addendum)
Patient presents to the clinic today unaccompanied for a follow up appointment with Dr. Michell Heinrich. Patient alert and oriented to person, place and time. No distress noted. Steady gait noted. Pleasant affect noted. Patient denies pain at this time. Patient denies breast pain. Patient reports she felt more discomfort in her right breast than her left during the recent mammogram and wonders if this will improve with time. Patient denies nipple discharge. Patient reports that her radiated breast has returned to normal appearance and color. Patient reports using otc moisturizer. Patient reports that energy level is normal. She reports eating and sleeping without difficulty. Patient continues to take tamoxifen as directed. Reported all findings to Dr. Michell Heinrich.

## 2012-10-08 NOTE — Progress Notes (Signed)
   Department of Radiation Oncology  Phone:  [phone] Fax:        [phone]   Name: Zarin Hagmann Fuchs   DOB: 1961/12/14  MRN: [id number]    Date: 10/08/2012  Follow Up Visit Note  Diagnosis: DCIS of the right breast  Interval since last radiation: 7 months  Interval History: Connie Jones presents today for routine followup.  She is feeling well and doing well. She is tolerating her tamoxifen well. She had mammogram earlier this month which was negative. She is questioning whether she needs to followup with Drs. Leeroy Cha and myself. She has no breast related complaints. He is pleased with her cosmetic result.  Allergies: No Known Allergies  Medications:  Current Outpatient Prescriptions  Medication Sig Dispense Refill  . calcium citrate-vitamin D 200-200 MG-UNIT TABS Take 2 tablets by mouth 2 (two) times daily.        . fish oil-omega-3 fatty acids 1000 MG capsule Take 2 g by mouth daily.      Marland Kitchen glucosamine-chondroitin 500-400 MG tablet Take 3 tablets by mouth daily.        . Multiple Vitamins-Minerals (MULTIVITAMIN WITH MINERALS) tablet Take 1 tablet by mouth daily.        . tamoxifen (NOLVADEX) 20 MG tablet Take 1 tablet (20 mg total) by mouth daily.  90 tablet  3  . vitamin E 1000 UNIT capsule Take 1,000 Units by mouth daily.        Physical Exam:   weight is 128 lb 14.4 oz (58.469 kg). Her oral temperature is 98.5 F (36.9 C). Her blood pressure is 100/66 and her pulse is 67. Her respiration is 18.  She has a well-healed incision on the lateral aspect of her right area left. She has some scar tissue present. Her skin is well-healed. No palpable abnormalities of the left breast. No palpable axillary adenopathy. No palpable supraclavicular adenopathy.  IMPRESSION: Connie Jones is a 51 y.o. female status post breast conservation for DCIS of the right breast with no evidence of disease  PLAN:  Connie Jones looks great. We talked about continuing her yearly mammograms. She would like to  follow just with Dr. Donnie Coffin and Dr. Dwain Sarna. I released her from followup with me. I would be happy to see her back at any point in the future.    Lurline Hare, MD

## 2012-12-10 ENCOUNTER — Telehealth: Payer: Self-pay | Admitting: *Deleted

## 2012-12-10 ENCOUNTER — Other Ambulatory Visit (HOSPITAL_BASED_OUTPATIENT_CLINIC_OR_DEPARTMENT_OTHER): Payer: BC Managed Care – PPO | Admitting: Lab

## 2012-12-10 ENCOUNTER — Ambulatory Visit (HOSPITAL_BASED_OUTPATIENT_CLINIC_OR_DEPARTMENT_OTHER): Payer: BC Managed Care – PPO | Admitting: Oncology

## 2012-12-10 VITALS — BP 97/62 | HR 67 | Temp 98.7°F | Resp 20 | Ht 66.5 in | Wt 127.8 lb

## 2012-12-10 DIAGNOSIS — D0510 Intraductal carcinoma in situ of unspecified breast: Secondary | ICD-10-CM

## 2012-12-10 DIAGNOSIS — D0590 Unspecified type of carcinoma in situ of unspecified breast: Secondary | ICD-10-CM

## 2012-12-10 DIAGNOSIS — Z170 Estrogen receptor positive status [ER+]: Secondary | ICD-10-CM

## 2012-12-10 LAB — COMPREHENSIVE METABOLIC PANEL (CC13)
Albumin: 3.7 g/dL (ref 3.5–5.0)
Alkaline Phosphatase: 38 U/L — ABNORMAL LOW (ref 40–150)
BUN: 15 mg/dL (ref 7.0–26.0)
CO2: 29 mEq/L (ref 22–29)
Glucose: 89 mg/dl (ref 70–99)
Potassium: 4.3 mEq/L (ref 3.5–5.1)
Total Bilirubin: 0.57 mg/dL (ref 0.20–1.20)

## 2012-12-10 LAB — CBC WITH DIFFERENTIAL/PLATELET
Basophils Absolute: 0 10*3/uL (ref 0.0–0.1)
Eosinophils Absolute: 0.1 10*3/uL (ref 0.0–0.5)
HGB: 13 g/dL (ref 11.6–15.9)
MCV: 97 fL (ref 79.5–101.0)
MONO#: 0.4 10*3/uL (ref 0.1–0.9)
MONO%: 9.3 % (ref 0.0–14.0)
NEUT#: 2.7 10*3/uL (ref 1.5–6.5)
RBC: 3.96 10*6/uL (ref 3.70–5.45)
RDW: 13.1 % (ref 11.2–14.5)
WBC: 4 10*3/uL (ref 3.9–10.3)

## 2012-12-10 NOTE — Progress Notes (Signed)
Hematology and Oncology Follow Up Visit  Connie Jones [id number] [birth year] 51 y.o. 12/10/2012 1:03 PM   DIAGNOSIS: History of DCIS status post resection 10/26/2011, status post completion of radiation March 2013.  All currently on tamoxifen adjuvantly.    PAST THERAPY: As above   Interim History:    Patient returns for followup. She is doing well. She tolerates tamoxifen well. She is amenorrheic. Has minimal hot flashes but is taking Peridin c  as well as vitamin E, which seems to be helping more than the Peridin C. She is having muscle cramps.  Appetite good , weight is stable. Denies headaches blurred vision  Medications: I have reviewed the patient's current medications.  Allergies: No Known Allergies  Past Medical History, Surgical history, Social history, and Family History were reviewed and updated.  Review of Systems: Constitutional:  Negative for fever, chills, night sweats, anorexia, weight loss, pain. Cardiovascular: no chest pain or dyspnea on exertion Respiratory: negative Neurological: negative Dermatological: negative ENT: negative Skin Gastrointestinal: negative Genito-Urinary: negative Hematological and Lymphatic: negative Breast: negative Musculoskeletal: negative Remaining ROS negative.  Physical Exam:  Blood pressure 97/62, pulse 67, temperature 98.7 F (37.1 C), temperature source Oral, resp. rate 20, height 5' 6.5" (1.689 m), weight 127 lb 12.8 oz (57.97 kg).  ECOG: 0  HEENT:  Sclerae anicteric, conjunctivae pink.  Oropharynx clear.  No mucositis or candidiasis.  Nodes:  No cervical, supraclavicular, or axillary lymphadenopathy palpated.  Breast Exam:  Right breast is benign.  No masses, discharge, skin change, or nipple inversion.  Left breast is benign.  No masses, discharge, skin change, or nipple inversion..  Lungs:  Clear to auscultation bilaterally.  No crackles, rhonchi, or wheezes.  Heart:  Regular rate and rhythm.  Abdomen:  Soft, nontender.   Positive bowel sounds.  No organomegaly or masses palpated.  Musculoskeletal:  No focal spinal tenderness to palpation.  Extremities:  Benign.  No peripheral edema or cyanosis.  Skin:  Benign.  Neuro:  Nonfocal.   Lab Results: Lab Results  Component Value Date   WBC 4.0 12/10/2012   HGB 13.0 12/10/2012   HCT 38.4 12/10/2012   MCV 97.0 12/10/2012   PLT 138* 12/10/2012     Chemistry      Component Value Date/Time   NA 137 12/10/2012 1138   NA 140 06/01/2012 1559   K 4.3 12/10/2012 1138   K 4.0 06/01/2012 1559   CL 107 12/10/2012 1138   CL 104 06/01/2012 1559   CO2 29 12/10/2012 1138   CO2 27 06/01/2012 1559   BUN 15.0 12/10/2012 1138   BUN 13 06/01/2012 1559   CREATININE 0.8 12/10/2012 1138   CREATININE 0.75 06/01/2012 1559      Component Value Date/Time   CALCIUM 9.3 12/10/2012 1138   CALCIUM 8.9 06/01/2012 1559   ALKPHOS 38* 12/10/2012 1138   ALKPHOS 49 06/01/2012 1559   AST 19 12/10/2012 1138   AST 20 06/01/2012 1559   ALT 15 12/10/2012 1138   ALT 22 06/01/2012 1559   BILITOT 0.57 12/10/2012 1138   BILITOT 0.2* 06/01/2012 1559       Radiological Studies:  No results found.   IMPRESSIONS AND PLAN: A 51 y.o. female with   History of ER/PR positive DCIS status post lumpectomy, radiation currently on tamoxifen. She will take additional vitamin E for hot flashes and we will see her in 6 months with appropriate imaging.  Spent more than half the time coordinating care.    Harel Repetto 12/20/20131:03 PM

## 2012-12-10 NOTE — Telephone Encounter (Signed)
Gave patient instructions on getting her six months

## 2013-02-15 ENCOUNTER — Telehealth: Payer: Self-pay | Admitting: Oncology

## 2013-02-15 NOTE — Telephone Encounter (Signed)
Faxed pt medical records to Dr. Gates °

## 2013-04-06 ENCOUNTER — Encounter: Payer: Self-pay | Admitting: Oncology

## 2013-04-06 ENCOUNTER — Telehealth: Payer: Self-pay | Admitting: *Deleted

## 2013-04-06 NOTE — Telephone Encounter (Signed)
Pt called and left me a message to get a new appt w/ a new provider and I called and left her a message to call me back.

## 2013-04-06 NOTE — Telephone Encounter (Signed)
Pt returned my call and requested to see Dr. Welton Flakes.  Confirmed 06/03/13 appt w/ pt.  Mailed letter & calendar to pt.

## 2013-05-18 ENCOUNTER — Other Ambulatory Visit: Payer: Self-pay | Admitting: *Deleted

## 2013-05-18 DIAGNOSIS — C50919 Malignant neoplasm of unspecified site of unspecified female breast: Secondary | ICD-10-CM

## 2013-05-18 MED ORDER — TAMOXIFEN CITRATE 20 MG PO TABS: 20.0000 mg | ORAL_TABLET | Freq: Every day | ORAL | Status: DC

## 2013-05-18 NOTE — Telephone Encounter (Signed)
Approved Tamoxifen X 1 month. Has 1st appointment with Dr. Welton Flakes on 06/03/13.

## 2013-06-03 ENCOUNTER — Telehealth: Payer: Self-pay | Admitting: *Deleted

## 2013-06-03 ENCOUNTER — Ambulatory Visit (HOSPITAL_BASED_OUTPATIENT_CLINIC_OR_DEPARTMENT_OTHER): Payer: BC Managed Care – PPO | Admitting: Oncology

## 2013-06-03 ENCOUNTER — Encounter: Payer: Self-pay | Admitting: Oncology

## 2013-06-03 ENCOUNTER — Ambulatory Visit (HOSPITAL_BASED_OUTPATIENT_CLINIC_OR_DEPARTMENT_OTHER): Payer: BC Managed Care – PPO | Admitting: Lab

## 2013-06-03 VITALS — BP 100/62 | HR 72 | Temp 98.3°F | Resp 20 | Ht 66.5 in | Wt 131.4 lb

## 2013-06-03 DIAGNOSIS — D0590 Unspecified type of carcinoma in situ of unspecified breast: Secondary | ICD-10-CM

## 2013-06-03 DIAGNOSIS — D0511 Intraductal carcinoma in situ of right breast: Secondary | ICD-10-CM

## 2013-06-03 LAB — CBC WITH DIFFERENTIAL/PLATELET
Basophils Absolute: 0 10*3/uL (ref 0.0–0.1)
EOS%: 1.4 % (ref 0.0–7.0)
HCT: 40.3 % (ref 34.8–46.6)
HGB: 13.7 g/dL (ref 11.6–15.9)
MCH: 32.1 pg (ref 25.1–34.0)
MCV: 94.9 fL (ref 79.5–101.0)
MONO%: 9.9 % (ref 0.0–14.0)
NEUT%: 65.4 % (ref 38.4–76.8)
lymph#: 0.9 10*3/uL (ref 0.9–3.3)

## 2013-06-03 LAB — COMPREHENSIVE METABOLIC PANEL (CC13)
AST: 20 U/L (ref 5–34)
BUN: 13.4 mg/dL (ref 7.0–26.0)
Calcium: 9.5 mg/dL (ref 8.4–10.4)
Chloride: 106 mEq/L (ref 98–107)
Creatinine: 0.8 mg/dL (ref 0.6–1.1)
Glucose: 78 mg/dl (ref 70–99)

## 2013-06-03 MED ORDER — TAMOXIFEN CITRATE 20 MG PO TABS: 20.0000 mg | ORAL_TABLET | Freq: Every day | ORAL | Status: DC

## 2013-06-03 NOTE — Telephone Encounter (Signed)
appts made and printed...td 

## 2013-06-03 NOTE — Patient Instructions (Addendum)
Continue tamoxifen 20 mg daily at the same time  We discussed yoga/exercise/acupuncture for hot flashes  I will see you back in 6 months

## 2013-06-03 NOTE — Progress Notes (Signed)
OFFICE PROGRESS NOTE  CC  Connie Espy, MD [address] [place] [address] Dr. Lurline Hare Dr. Emelia Loron  DIAGNOSIS: 52 year old female with DCIS of the right breast diagnosed November 2012.   STAGE DCIS (ductal carcinoma in situ) of breast   Primary site: Breast   Staging method: AJCC 7th Edition   Pathologic: Stage 0 (Tis (DCIS), N0, cM0) signed by Lurline Hare, MD on 03/18/2012 10:49 AM   Summary: Stage 0 (Tis (DCIS), N0, cM0)  PRIOR THERAPY: #1 patient at the age of 84 went for a screening mammogram in October 2012. This showed 3 areas of calcifications in the lateral aspect of the right breast. Magnification views showed a cluster of pleomorphic calcifications in the anterior 12:00 position. Other areas on magnification views appear benign.  #2 stereotactic biopsy performed of these pleomorphic calcification showed atypical ductal hyperplasia and lobular carcinoma in situ arising in a radial scar. MRI of the breasts bilaterally performed on 10/17/2011 showed nodular non-masslike enhancement measuring 1.9 x 1.5 centimeters. A 2.4 x 1.6 x 1.6 area of non-mass like enhancement was also noted in the upper outer quadrant of the right breast at the location of the additional microcalcifications concerning for LCIS. She therefore underwent MRI guided biopsy on 10/24/2011, the pathology revealed a complex sclerosing lesion with atypical lobular hyperplasia. Excision was recommended.  #3 on 11/05/2011 patient had excision of both of these areas. She was noted to have ductal carcinoma in situ on the background of complex sclerosing lesion with microcalcifications and previous biopsy changes. The closest margin was inferiorly at 0.2 cm.  #4 she was seen by Dr. Lurline Hare who recommended radiation therapy and she began this starting 01/12/2012 through 02/24/2012.   #5 patient was also seen by Dr. Pierce Crane who recommended adjuvant antiestrogen therapy with  tamoxifen 20 mg daily starting in June 2013. A plan of 5 years of therapy was recommended.   CURRENT THERAPY: tamoxifen 20 mg daily since June 2013  INTERVAL HISTORY: Connie Jones 52 y.o. female returns for followup visit. Her last visit with Dr. Donnie Coffin was on 12/10/2012. Clinically today patient seems to be doing well and is without any significant complaints or complications from her therapy. She denies any fevers chills night sweats headaches shortness of breath chest pains palpitations no peripheral paresthesias no myalgias and arthralgias no vaginal bleeding no lower extremity swelling no history of blood clots. She has no blurred vision no double vision her appetite remains stable and her weight is stable as well. Remainder of the 10 point review of systems is negative.  MEDICAL HISTORY: Past Medical History  Diagnosis Date  . Arthritis     mild in knees  . Breast cancer     ALLERGIES:  has No Known Allergies.  MEDICATIONS:  Current Outpatient Prescriptions  Medication Sig Dispense Refill  . calcium citrate-vitamin D 200-200 MG-UNIT TABS Take 2 tablets by mouth 2 (two) times daily.        . fish oil-omega-3 fatty acids 1000 MG capsule Take 2 g by mouth daily.      Marland Kitchen glucosamine-chondroitin 500-400 MG tablet Take 3 tablets by mouth daily.        . Multiple Vitamins-Minerals (MULTIVITAMIN WITH MINERALS) tablet Take 1 tablet by mouth daily.        . Probiotic Product (PROBIOTIC PO) Take by mouth daily.      . tamoxifen (NOLVADEX) 20 MG tablet Take 1 tablet (20 mg total) by mouth daily.  30 tablet  0  . vitamin E 1000 UNIT capsule Take 1,000 Units by mouth daily.       No current facility-administered medications for this visit.    SURGICAL HISTORY:  Past Surgical History  Procedure Laterality Date  . Cesarean section    . Laparoscopy      Infertility studies  . Breast biopsy  11/05/2011    Procedure: BREAST BIOPSY WITH NEEDLE LOCALIZATION;  Surgeon: Emelia Loron, MD;   Location: [facility];  Service: General;  Laterality: Right;  needle localization times two at [facility] 12:30    REVIEW OF SYSTEMS:  Pertinent items are noted in HPI.   HEALTH MAINTENANCE:  PHYSICAL EXAMINATION: Blood pressure 100/62, pulse 72, temperature 98.3 F (36.8 C), temperature source Oral, resp. rate 20, height 5' 6.5" (1.689 m), weight 131 lb 6.4 oz (59.603 kg). Body mass index is 20.89 kg/(m^2). ECOG PERFORMANCE STATUS: 0 - Asymptomatic   General appearance: alert, cooperative and appears stated age Resp: clear to auscultation bilaterally Cardio: regular rate and rhythm GI: soft, non-tender; bowel sounds normal; no masses,  no organomegaly Genitalia: defer exam Neurologic: Grossly normal  bilateral breast examination right breast no masses or nipple discharge well-healed surgical scar from lumpectomy. Left breast no masses or nipple discharge.  LABORATORY DATA: Lab Results  Component Value Date   WBC 4.0 12/10/2012   HGB 13.0 12/10/2012   HCT 38.4 12/10/2012   MCV 97.0 12/10/2012   PLT 138* 12/10/2012      Chemistry      Component Value Date/Time   NA 137 12/10/2012 1138   NA 140 06/01/2012 1559   K 4.3 12/10/2012 1138   K 4.0 06/01/2012 1559   CL 107 12/10/2012 1138   CL 104 06/01/2012 1559   CO2 29 12/10/2012 1138   CO2 27 06/01/2012 1559   BUN 15.0 12/10/2012 1138   BUN 13 06/01/2012 1559   CREATININE 0.8 12/10/2012 1138   CREATININE 0.75 06/01/2012 1559      Component Value Date/Time   CALCIUM 9.3 12/10/2012 1138   CALCIUM 8.9 06/01/2012 1559   ALKPHOS 38* 12/10/2012 1138   ALKPHOS 49 06/01/2012 1559   AST 19 12/10/2012 1138   AST 20 06/01/2012 1559   ALT 15 12/10/2012 1138   ALT 22 06/01/2012 1559   BILITOT 0.57 12/10/2012 1138   BILITOT 0.2* 06/01/2012 1559     FINAL DIAGNOSIS Diagnosis Breast, lumpectomy, Right - FOCAL LOW GRADE DUCTAL CARCINOMA IN SITU - BACKGROUND OF COMPLEX SCLEROSING LESION ASSOCIATED WITH MICROCALCIFICATION AND  PREVIOUS BIOPSY SITE CHANGES. - RESECTION MARGINS ARE NEGATIVE FOR ATYPIA OR MALIGNANCY. Microscopic Comment BREAST, IN SITU CARCINOMA Specimen, including laterality: Right breast. Procedure: Lumpectomy Lymph node sampling performed: No. Estimated tumor size (glass slide measurement): 0.2 cm Histologic type: Ductal carcinoma in situ. Nuclear grade: Low grade. Necrosis: No. Treatment effect (if treated with neoadjuvant therapy): No Distance to closest margin: Inferior margin, 0.2 cm Breast Prognostic Profile: will be performed and addendum report will follow. Axillary lymph nodes: N/A TNM: pTis, pNX Comments: There is a microscopic focus of low grade ductal carcinoma in situ identified in a background of complex sclerosing lesion with associated microcalcifications. Previous excisional site changes are also present. The tumor is 0.2 cm from the inked inferior margin. ER/PR will be performed and an addendum report will follow. (HCL:gt, 11/07/11) Abigail Miyamoto MD Pathologist, Electronic Signature (Case signed 11/07/2011) PROGNOSTIC INDICATORS - ACIS 1 of 3 FINAL for Westley, Teyona W [phone]) (continued) Results IMMUNOHISTOCHEMICAL AND MORPHOMETRIC  ANALYSIS BY THE AUTOMATED CELLULAR IMAGING SYSTEM (ACIS) Estrogen Receptor (Negative, <1%): 82%, STRONG STAINING INTENSITY Progesterone Receptor (Negative, <1%): 72%, MODERATE STAINING INTENSITY All controls stained appropriately Pecola Leisure MD Pathologist, Electronic Signature  RADIOGRAPHIC STUDIES:  No results found.  ASSESSMENT:  52 year old female with  #1 history of DCIS in the background of complex sclerosing lesion. The DCIS only measure 0.2 cm. It was ER +82% PR +72%. Post lumpectomy patient underwent radiation therapy. In June 2013 she began tamoxifen 20 mg as a adjuvant therapy to prevent future breast cancer risk.  Patient has no evidence of recurrent disease.  #2 her last mammogram was 10/04/2012 so therefore she will  be due one this year as well.  #3 patient is experiencing hot flashes we did discuss yoga exercise and acupuncture for hot flashes she is going to try this.    PLAN:   #1 continue tamoxifen 20 mg daily.  #2 she will be seen back in 6 months time.    All questions were answered. The patient knows to call the clinic with any problems, questions or concerns. We can certainly see the patient much sooner if necessary.  I spent 25 minutes counseling the patient face to face. The total time spent in the appointment was 30 minutes.    Drue Second, MD Medical/Oncology [facility] [phone] (beeper) [phone] (Office)  06/03/2013, 9:51 AM

## 2013-09-13 ENCOUNTER — Other Ambulatory Visit: Payer: Self-pay | Admitting: Obstetrics & Gynecology

## 2013-09-13 DIAGNOSIS — Z853 Personal history of malignant neoplasm of breast: Secondary | ICD-10-CM

## 2013-09-13 DIAGNOSIS — Z9889 Other specified postprocedural states: Secondary | ICD-10-CM

## 2013-09-14 ENCOUNTER — Other Ambulatory Visit: Payer: Self-pay | Admitting: Obstetrics & Gynecology

## 2013-09-14 DIAGNOSIS — Z780 Asymptomatic menopausal state: Secondary | ICD-10-CM

## 2013-10-15 IMAGING — MG MM DIGITAL DIAGNOSTIC UNILAT*R*
2 series · 2 of 2 positions shown · non-contrast
Comparison: MRI on 10/24/2011; 10/10/2011 and earlier from [facility]

CLINICAL DATA: Status post MR guided core biopsy of the right
breast.

DIGITAL DIAGNOSTIC RIGHT MAMMOGRAM

[R CC]
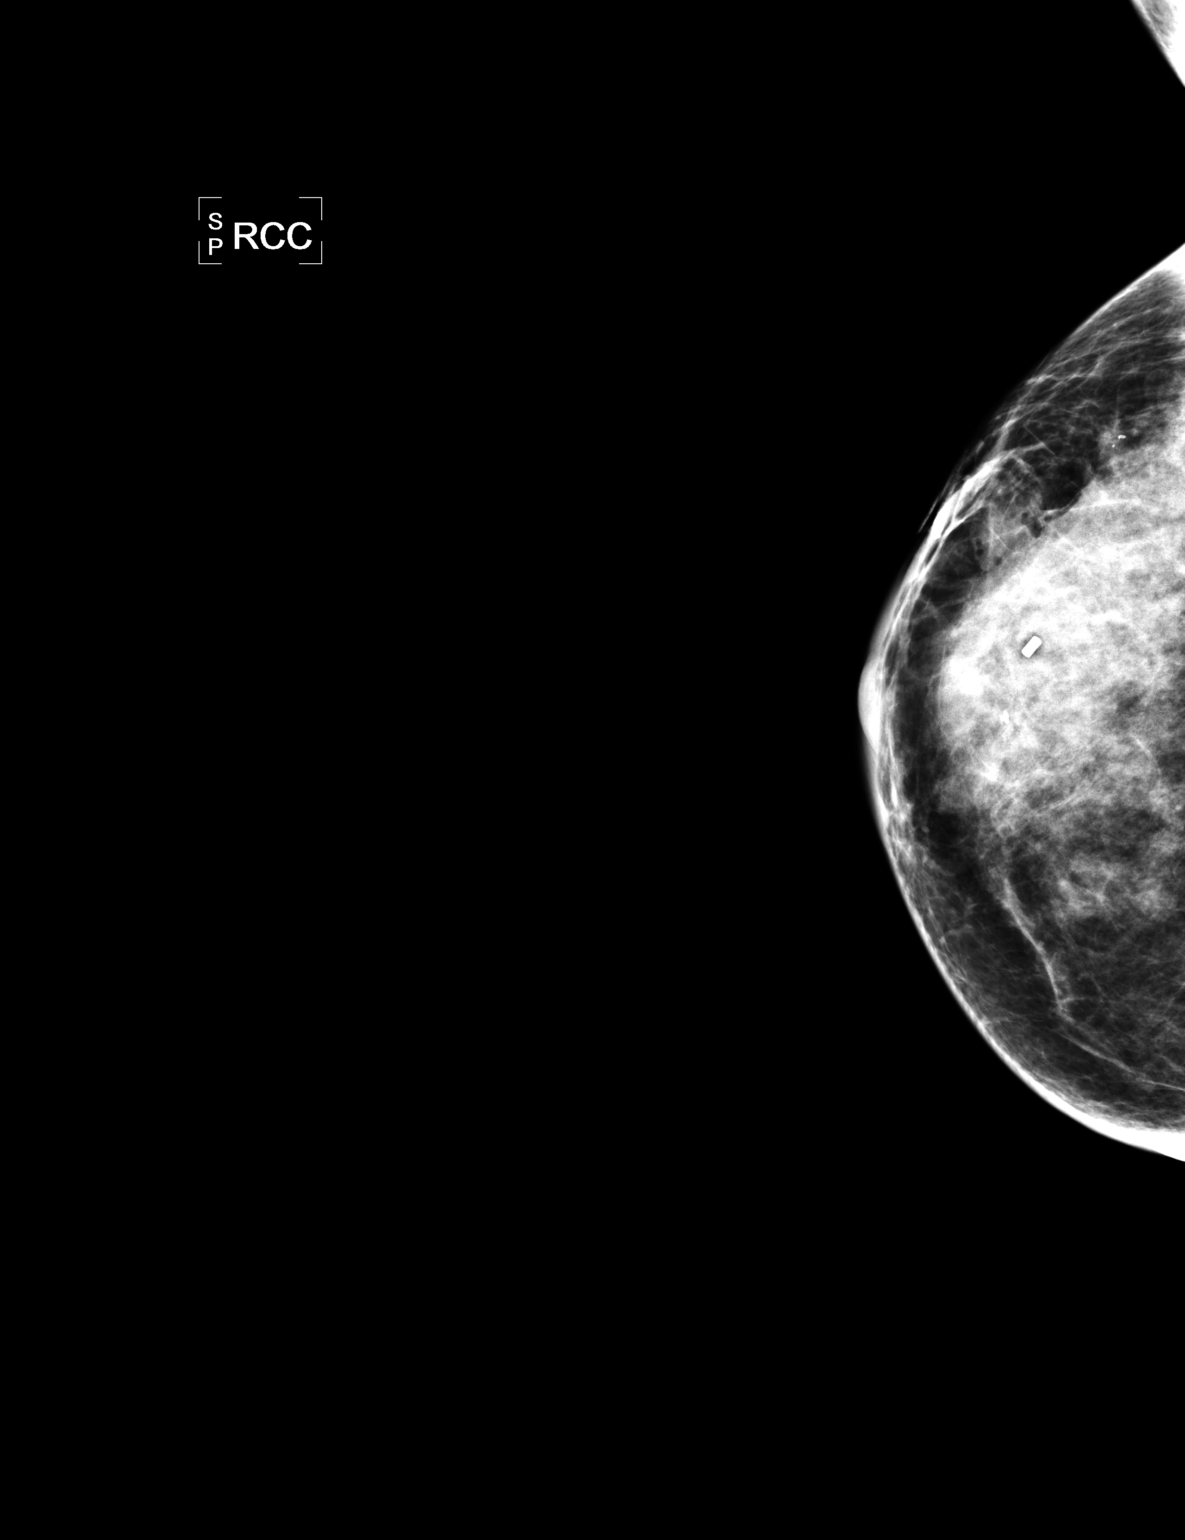

[R ML]
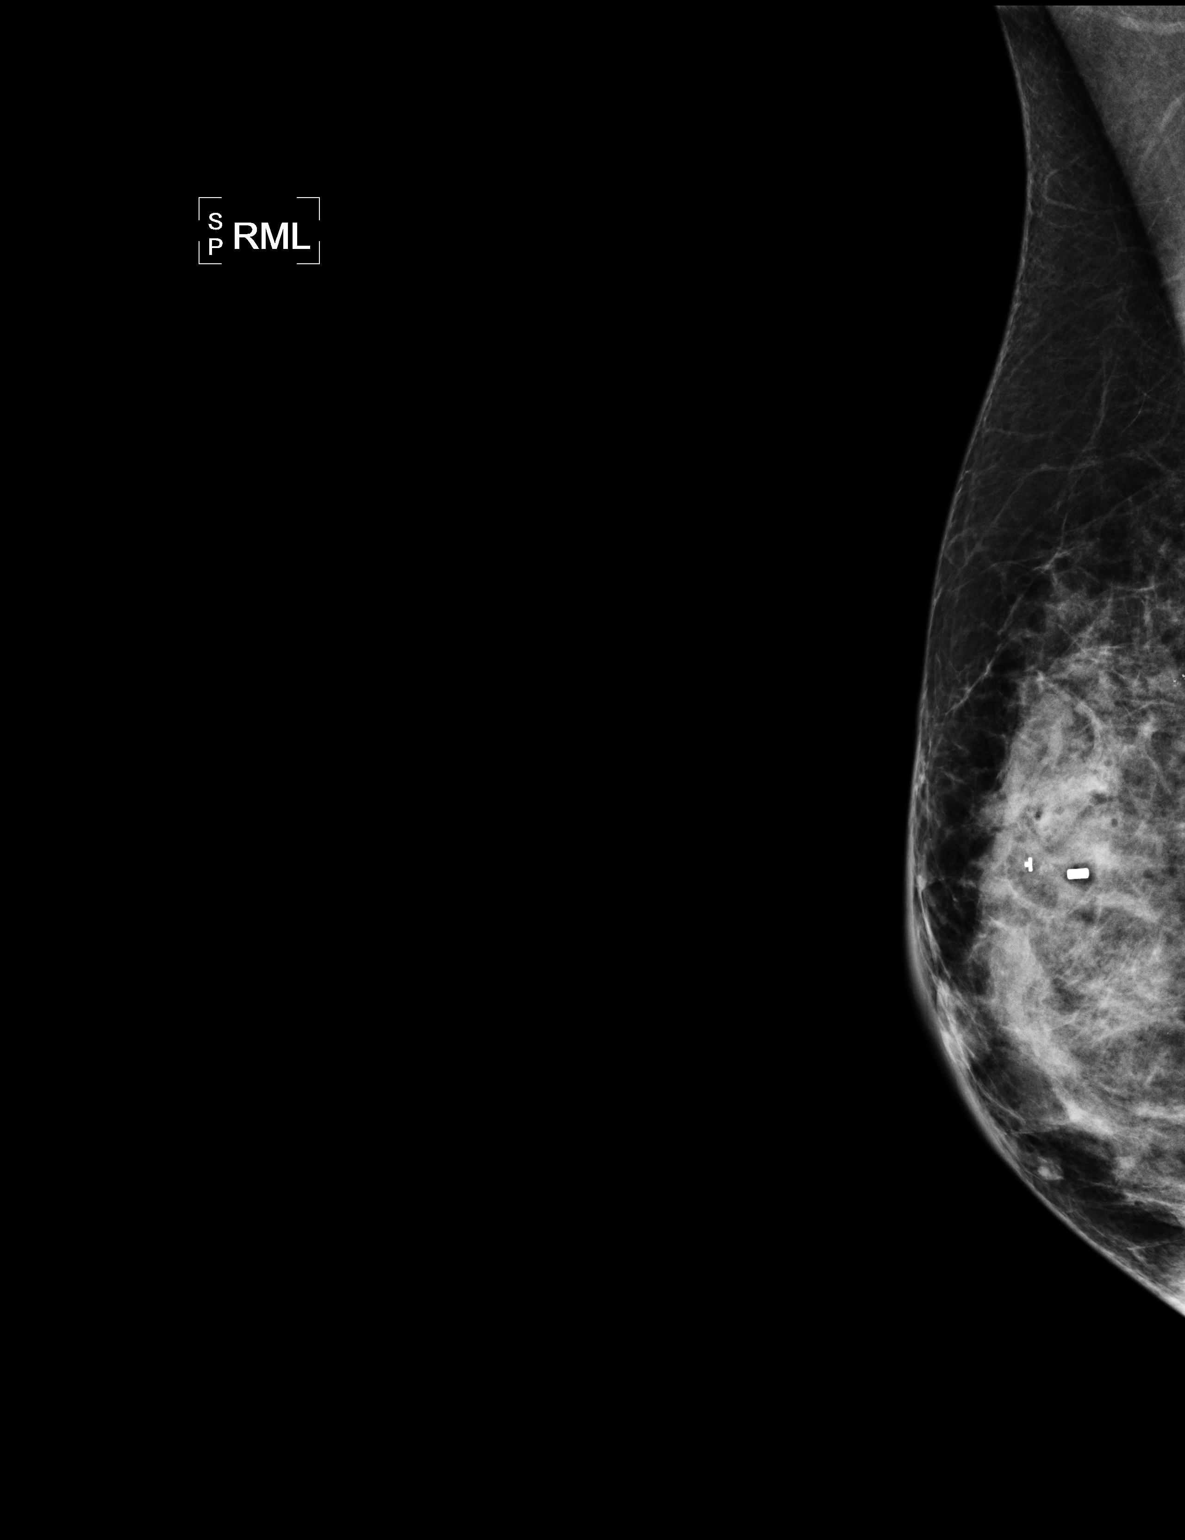

[2 of 2 positions shown; findings below may reference images not displayed]

FINDINGS: Films are performed following MRI guided biopsy of the
upper outer quadrant of the right breast.  A cylinder shaped clip
is identified in the upper-outer quadrant following MR guided core
biopsy.  This clip is 1.4 cm lateral to the clip placed at the time
of previous biopsy which showed LCIS.
IMPRESSION: Tissue marker clip is in expected location after biopsy.

## 2013-10-26 ENCOUNTER — Ambulatory Visit
Admission: RE | Admit: 2013-10-26 | Discharge: 2013-10-26 | Disposition: A | Discharge status: Home / Self Care | Payer: BC Managed Care – PPO | Source: Ambulatory Visit | Attending: Obstetrics & Gynecology | Admitting: Obstetrics & Gynecology

## 2013-10-26 DIAGNOSIS — Z9889 Other specified postprocedural states: Secondary | ICD-10-CM

## 2013-10-26 DIAGNOSIS — Z780 Asymptomatic menopausal state: Secondary | ICD-10-CM

## 2013-10-26 DIAGNOSIS — Z853 Personal history of malignant neoplasm of breast: Secondary | ICD-10-CM

## 2013-10-27 ENCOUNTER — Other Ambulatory Visit: Payer: Self-pay

## 2013-12-05 ENCOUNTER — Other Ambulatory Visit (HOSPITAL_BASED_OUTPATIENT_CLINIC_OR_DEPARTMENT_OTHER): Payer: BC Managed Care – PPO

## 2013-12-05 ENCOUNTER — Telehealth: Payer: Self-pay | Admitting: Oncology

## 2013-12-05 ENCOUNTER — Ambulatory Visit (HOSPITAL_BASED_OUTPATIENT_CLINIC_OR_DEPARTMENT_OTHER): Payer: BC Managed Care – PPO | Admitting: Oncology

## 2013-12-05 VITALS — BP 99/64 | HR 68 | Temp 98.0°F | Resp 20 | Ht 66.5 in | Wt 129.7 lb

## 2013-12-05 DIAGNOSIS — D0511 Intraductal carcinoma in situ of right breast: Secondary | ICD-10-CM

## 2013-12-05 DIAGNOSIS — D0590 Unspecified type of carcinoma in situ of unspecified breast: Secondary | ICD-10-CM

## 2013-12-05 DIAGNOSIS — C50911 Malignant neoplasm of unspecified site of right female breast: Secondary | ICD-10-CM

## 2013-12-05 LAB — COMPREHENSIVE METABOLIC PANEL (CC13)
AST: 20 U/L (ref 5–34)
Albumin: 3.9 g/dL (ref 3.5–5.0)
Alkaline Phosphatase: 36 U/L — ABNORMAL LOW (ref 40–150)
Chloride: 110 mEq/L — ABNORMAL HIGH (ref 98–109)
Glucose: 84 mg/dl (ref 70–140)
Potassium: 4.4 mEq/L (ref 3.5–5.1)
Sodium: 145 mEq/L (ref 136–145)
Total Protein: 6.5 g/dL (ref 6.4–8.3)

## 2013-12-05 LAB — CBC WITH DIFFERENTIAL/PLATELET
Eosinophils Absolute: 0.1 10*3/uL (ref 0.0–0.5)
LYMPH%: 25.3 % (ref 14.0–49.7)
MCH: 32 pg (ref 25.1–34.0)
MCV: 96.1 fL (ref 79.5–101.0)
MONO%: 11.1 % (ref 0.0–14.0)
NEUT#: 2.4 10*3/uL (ref 1.5–6.5)
RBC: 4.18 10*6/uL (ref 3.70–5.45)
RDW: 13.5 % (ref 11.2–14.5)
WBC: 4 10*3/uL (ref 3.9–10.3)
lymph#: 1 10*3/uL (ref 0.9–3.3)

## 2013-12-09 NOTE — Progress Notes (Signed)
OFFICE PROGRESS NOTE  CC  Connie Espy, MD [address] [place] [address] Dr. Lurline Hare Dr. Emelia Loron  DIAGNOSIS: 52 year old female with DCIS of the right breast diagnosed November 2012.   STAGE DCIS (ductal carcinoma in situ) of breast   Primary site: Breast   Staging method: AJCC 7th Edition   Pathologic: Stage 0 (Tis (DCIS), N0, cM0) signed by Lurline Hare, MD on 03/18/2012 10:49 AM   Summary: Stage 0 (Tis (DCIS), N0, cM0)  PRIOR THERAPY: #1 patient at the age of 51 went for a screening mammogram in October 2012. This showed 3 areas of calcifications in the lateral aspect of the right breast. Magnification views showed a cluster of pleomorphic calcifications in the anterior 12:00 position. Other areas on magnification views appear benign.  #2 stereotactic biopsy performed of these pleomorphic calcification showed atypical ductal hyperplasia and lobular carcinoma in situ arising in a radial scar. MRI of the breasts bilaterally performed on 10/17/2011 showed nodular non-masslike enhancement measuring 1.9 x 1.5 centimeters. A 2.4 x 1.6 x 1.6 area of non-mass like enhancement was also noted in the upper outer quadrant of the right breast at the location of the additional microcalcifications concerning for LCIS. She therefore underwent MRI guided biopsy on 10/24/2011, the pathology revealed a complex sclerosing lesion with atypical lobular hyperplasia. Excision was recommended.  #3 on 11/05/2011 patient had excision of both of these areas. She was noted to have ductal carcinoma in situ on the background of complex sclerosing lesion with microcalcifications and previous biopsy changes. The closest margin was inferiorly at 0.2 cm.  #4 she was seen by Dr. Lurline Hare who recommended radiation therapy and she began this starting 01/12/2012 through 02/24/2012.   #5 patient was also seen by Dr. Pierce Crane who recommended adjuvant antiestrogen  therapy with tamoxifen 20 mg daily starting in June 2013. A plan of 5 years of therapy was recommended.   CURRENT THERAPY: tamoxifen 20 mg daily since June 2013  INTERVAL HISTORY: Mahogany Torrance Minetti 52 y.o. female returns for followup visit.Clinically today patient seems to be doing well and is without any significant complaints or complications from her therapy. She denies any fevers chills night sweats headaches shortness of breath chest pains palpitations no peripheral paresthesias no myalgias and arthralgias no vaginal bleeding no lower extremity swelling no history of blood clots. She has no blurred vision no double vision her appetite remains stable and her weight is stable as well. Remainder of the 10 point review of systems is negative.  MEDICAL HISTORY: Past Medical History  Diagnosis Date  . Arthritis     mild in knees  . Breast cancer     ALLERGIES:  has No Known Allergies.  MEDICATIONS:  Current Outpatient Prescriptions  Medication Sig Dispense Refill  . calcium citrate-vitamin D 200-200 MG-UNIT TABS Take 2 tablets by mouth 2 (two) times daily.        . fish oil-omega-3 fatty acids 1000 MG capsule Take 2 g by mouth daily.      Marland Kitchen glucosamine-chondroitin 500-400 MG tablet Take 3 tablets by mouth daily.        . Multiple Vitamins-Minerals (MULTIVITAMIN WITH MINERALS) tablet Take 1 tablet by mouth daily.        . Probiotic Product (PROBIOTIC PO) Take by mouth daily.      . tamoxifen (NOLVADEX) 20 MG tablet Take 1 tablet (20 mg total) by mouth daily.  90 tablet  12  . vitamin E 1000 UNIT  capsule Take 1,000 Units by mouth daily.       No current facility-administered medications for this visit.    SURGICAL HISTORY:  Past Surgical History  Procedure Laterality Date  . Cesarean section    . Laparoscopy      Infertility studies  . Breast biopsy  11/05/2011    Procedure: BREAST BIOPSY WITH NEEDLE LOCALIZATION;  Surgeon: Emelia Loron, MD;  Location: [facility];   Service: General;  Laterality: Right;  needle localization times two at [facility] 12:30    REVIEW OF SYSTEMS:  Pertinent items are noted in HPI.   HEALTH MAINTENANCE:  PHYSICAL EXAMINATION: Blood pressure 99/64, pulse 68, temperature 98 F (36.7 C), temperature source Oral, resp. rate 20, height 5' 6.5" (1.689 m), weight 129 lb 11.2 oz (58.832 kg). Body mass index is 20.62 kg/(m^2). ECOG PERFORMANCE STATUS: 0 - Asymptomatic   General appearance: alert, cooperative and appears stated age Resp: clear to auscultation bilaterally Cardio: regular rate and rhythm GI: soft, non-tender; bowel sounds normal; no masses,  no organomegaly Genitalia: defer exam Neurologic: Grossly normal  bilateral breast examination right breast no masses or nipple discharge well-healed surgical scar from lumpectomy. Left breast no masses or nipple discharge.  LABORATORY DATA: Lab Results  Component Value Date   WBC 4.0 12/05/2013   HGB 13.4 12/05/2013   HCT 40.1 12/05/2013   MCV 96.1 12/05/2013   PLT 149 12/05/2013      Chemistry      Component Value Date/Time   NA 145 12/05/2013 0907   NA 140 06/01/2012 1559   K 4.4 12/05/2013 0907   K 4.0 06/01/2012 1559   CL 106 06/03/2013 1041   CL 104 06/01/2012 1559   CO2 27 12/05/2013 0907   CO2 27 06/01/2012 1559   BUN 11.3 12/05/2013 0907   BUN 13 06/01/2012 1559   CREATININE 0.8 12/05/2013 0907   CREATININE 0.75 06/01/2012 1559      Component Value Date/Time   CALCIUM 9.6 12/05/2013 0907   CALCIUM 8.9 06/01/2012 1559   ALKPHOS 36* 12/05/2013 0907   ALKPHOS 49 06/01/2012 1559   AST 20 12/05/2013 0907   AST 20 06/01/2012 1559   ALT 17 12/05/2013 0907   ALT 22 06/01/2012 1559   BILITOT 0.48 12/05/2013 0907   BILITOT 0.2* 06/01/2012 1559     FINAL DIAGNOSIS Diagnosis Breast, lumpectomy, Right - FOCAL LOW GRADE DUCTAL CARCINOMA IN SITU - BACKGROUND OF COMPLEX SCLEROSING LESION ASSOCIATED WITH MICROCALCIFICATION AND PREVIOUS BIOPSY SITE CHANGES. - RESECTION  MARGINS ARE NEGATIVE FOR ATYPIA OR MALIGNANCY. Microscopic Comment BREAST, IN SITU CARCINOMA Specimen, including laterality: Right breast. Procedure: Lumpectomy Lymph node sampling performed: No. Estimated tumor size (glass slide measurement): 0.2 cm Histologic type: Ductal carcinoma in situ. Nuclear grade: Low grade. Necrosis: No. Treatment effect (if treated with neoadjuvant therapy): No Distance to closest margin: Inferior margin, 0.2 cm Breast Prognostic Profile: will be performed and addendum report will follow. Axillary lymph nodes: N/A TNM: pTis, pNX Comments: There is a microscopic focus of low grade ductal carcinoma in situ identified in a background of complex sclerosing lesion with associated microcalcifications. Previous excisional site changes are also present. The tumor is 0.2 cm from the inked inferior margin. ER/PR will be performed and an addendum report will follow. (HCL:gt, 11/07/11) Abigail Miyamoto MD Pathologist, Electronic Signature (Case signed 11/07/2011) PROGNOSTIC INDICATORS - ACIS 1 of 3 FINAL for Dossantos, Tayte W [phone]) (continued) Results IMMUNOHISTOCHEMICAL AND MORPHOMETRIC ANALYSIS BY THE AUTOMATED CELLULAR IMAGING SYSTEM (  ACIS) Estrogen Receptor (Negative, <1%): 82%, STRONG STAINING INTENSITY Progesterone Receptor (Negative, <1%): 72%, MODERATE STAINING INTENSITY All controls stained appropriately Pecola Leisure MD Pathologist, Electronic Signature  RADIOGRAPHIC STUDIES:  No results found.  ASSESSMENT:  52 year old female with  #1 history of DCIS in the background of complex sclerosing lesion. The DCIS only measure 0.2 cm. It was ER +82% PR +72%. Post lumpectomy patient underwent radiation therapy. In June 2013 she began tamoxifen 20 mg as a adjuvant therapy to prevent future breast cancer risk.  Patient has no evidence of recurrent disease.    PLAN:   #1 continue tamoxifen 20 mg daily.  #2 she will be seen back in 6 months time.     All questions were answered. The patient knows to call the clinic with any problems, questions or concerns. We can certainly see the patient much sooner if necessary.  I spent 15 minutes counseling the patient face to face. The total time spent in the appointment was 30 minutes.    Drue Second, MD Medical/Oncology [facility] [phone] (beeper) [phone] (Office)  12/09/2013, 1:59 AM

## 2014-06-08 ENCOUNTER — Telehealth: Payer: Self-pay | Admitting: *Deleted

## 2014-06-08 NOTE — Telephone Encounter (Signed)
Left message for pt to return my call so I can reschedule her appt.

## 2014-06-16 ENCOUNTER — Other Ambulatory Visit (HOSPITAL_BASED_OUTPATIENT_CLINIC_OR_DEPARTMENT_OTHER): Payer: BC Managed Care – PPO

## 2014-06-16 ENCOUNTER — Telehealth: Payer: Self-pay

## 2014-06-16 ENCOUNTER — Telehealth: Payer: Self-pay | Admitting: Hematology and Oncology

## 2014-06-16 ENCOUNTER — Ambulatory Visit (HOSPITAL_BASED_OUTPATIENT_CLINIC_OR_DEPARTMENT_OTHER): Payer: BC Managed Care – PPO | Admitting: Hematology and Oncology

## 2014-06-16 VITALS — BP 105/63 | HR 77 | Temp 98.5°F | Resp 18 | Ht 66.5 in | Wt 131.6 lb

## 2014-06-16 DIAGNOSIS — Z853 Personal history of malignant neoplasm of breast: Secondary | ICD-10-CM

## 2014-06-16 DIAGNOSIS — D0501 Lobular carcinoma in situ of right breast: Secondary | ICD-10-CM

## 2014-06-16 DIAGNOSIS — C50911 Malignant neoplasm of unspecified site of right female breast: Secondary | ICD-10-CM

## 2014-06-16 DIAGNOSIS — Z79810 Long term (current) use of selective estrogen receptor modulators (SERMs): Secondary | ICD-10-CM

## 2014-06-16 LAB — COMPREHENSIVE METABOLIC PANEL (CC13)
ALBUMIN: 3.9 g/dL (ref 3.5–5.0)
ALK PHOS: 34 U/L — AB (ref 40–150)
ALT: 13 U/L (ref 0–55)
AST: 19 U/L (ref 5–34)
Anion Gap: 9 mEq/L (ref 3–11)
BILIRUBIN TOTAL: 0.46 mg/dL (ref 0.20–1.20)
BUN: 12.1 mg/dL (ref 7.0–26.0)
CO2: 27 mEq/L (ref 22–29)
Calcium: 9.2 mg/dL (ref 8.4–10.4)
Chloride: 107 mEq/L (ref 98–109)
Creatinine: 0.8 mg/dL (ref 0.6–1.1)
Glucose: 99 mg/dl (ref 70–140)
POTASSIUM: 4.3 meq/L (ref 3.5–5.1)
SODIUM: 142 meq/L (ref 136–145)
TOTAL PROTEIN: 6.2 g/dL — AB (ref 6.4–8.3)

## 2014-06-16 LAB — CBC WITH DIFFERENTIAL/PLATELET
BASO%: 0.6 % (ref 0.0–2.0)
Basophils Absolute: 0 10*3/uL (ref 0.0–0.1)
EOS%: 1.2 % (ref 0.0–7.0)
Eosinophils Absolute: 0.1 10*3/uL (ref 0.0–0.5)
HCT: 38.4 % (ref 34.8–46.6)
HGB: 12.4 g/dL (ref 11.6–15.9)
LYMPH%: 23 % (ref 14.0–49.7)
MCH: 31.3 pg (ref 25.1–34.0)
MCHC: 32.4 g/dL (ref 31.5–36.0)
MCV: 96.6 fL (ref 79.5–101.0)
MONO#: 0.4 10*3/uL (ref 0.1–0.9)
MONO%: 8.2 % (ref 0.0–14.0)
NEUT#: 3.3 10*3/uL (ref 1.5–6.5)
NEUT%: 67 % (ref 38.4–76.8)
Platelets: 154 10*3/uL (ref 145–400)
RBC: 3.98 10*6/uL (ref 3.70–5.45)
RDW: 13.3 % (ref 11.2–14.5)
WBC: 4.9 10*3/uL (ref 3.9–10.3)
lymph#: 1.1 10*3/uL (ref 0.9–3.3)

## 2014-06-16 MED ORDER — TAMOXIFEN CITRATE 20 MG PO TABS: 20.0000 mg | ORAL_TABLET | Freq: Every day | ORAL | Status: DC

## 2014-06-16 NOTE — Progress Notes (Signed)
OFFICE PROGRESS NOTE  CC  Connie Smolder, MD [place] [id number] Dr. Thea Silversmith Dr. Rolm Bookbinder  Chief complaint: Followup visit for breast cancer  DIAGNOSIS:  DCIS of the right breast diagnosed in November 2012.   STAGE DCIS (ductal carcinoma in situ) of breast   Primary site: Breast   Staging method: AJCC 7th Edition   Pathologic: Stage 0 (Tis (DCIS), N0, cM0) signed by Thea Silversmith, MD on 03/18/2012 10:49 AM   Summary: Stage 0 (Tis (DCIS), N0, cM0)  PRIOR THERAPY: From Original intake note:  #1 patient at the age of 29 went for a screening mammogram in October 2012. This showed 3 areas of calcifications in the lateral aspect of the right breast. Magnification views showed a cluster of pleomorphic calcifications in the anterior 12:00 position. Other areas on magnification views appear benign.  #2 stereotactic biopsy performed of these pleomorphic calcification showed atypical ductal hyperplasia and lobular carcinoma in situ arising in a radial scar. MRI of the breasts bilaterally performed on 10/17/2011 showed nodular non-masslike enhancement measuring 1.9 x 1.5 centimeters. A 2.4 x 1.6 x 1.6 area of non-mass like enhancement was also noted in the upper outer quadrant of the right breast at the location of the additional microcalcifications concerning for LCIS. She therefore underwent MRI guided biopsy on 10/24/2011, the pathology revealed a complex sclerosing lesion with atypical lobular hyperplasia. Excision was recommended.  #3 on 11/05/2011 patient had excision of both of these areas. She was noted to have ductal carcinoma in situ on the background of complex sclerosing lesion with microcalcifications and previous biopsy changes. The closest margin was inferiorly at 0.2 cm.  #4 she was seen by Dr. Thea Silversmith who recommended radiation therapy and she began this starting 01/12/2012 through 02/24/2012.   #5 patient was also  seen by Dr. Eston Esters who recommended adjuvant antiestrogen therapy with tamoxifen 20 mg daily starting in June 2013. A plan of 5 years of therapy was recommended.   CURRENT THERAPY: tamoxifen 20 mg daily since June 2013  INTERVAL HISTORY: Connie Jones 53 y.o. female returns for followup visit for breast cancer today. She is tolerating  tamoxifen quite well with minimal side effects of hot flushes. She is also on follows up with the gynecologist for her pelvic exams and Pap smears.  She denies any vaginal dryness, vaginal discharge, joint pains, appetite is good she does exercise and follows healthy diet. She denies any shortness of breath, chest pain, palpitations, blood in the stool or blood in the urine She denies any headaches or blurred vision or dizziness.  MEDICAL HISTORY: Past Medical History  Diagnosis Date  . Arthritis     mild in knees  . Breast cancer     ALLERGIES:  has No Known Allergies.  MEDICATIONS:  Current Outpatient Prescriptions  Medication Sig Dispense Refill  . calcium citrate-vitamin D 200-200 MG-UNIT TABS Take 2 tablets by mouth 2 (two) times daily.        . fish oil-omega-3 fatty acids 1000 MG capsule Take 2 g by mouth daily.      Marland Kitchen glucosamine-chondroitin 500-400 MG tablet Take 2 tablets by mouth daily.       . Multiple Vitamins-Minerals (MULTIVITAMIN WITH MINERALS) tablet Take 1 tablet by mouth daily.        . Probiotic Product (PROBIOTIC PO) Take by mouth daily.      . tamoxifen (NOLVADEX) 20 MG tablet Take 1 tablet (20 mg total) by mouth  daily.  90 tablet  12  . vitamin E 1000 UNIT capsule Take 1,000 Units by mouth daily.       No current facility-administered medications for this visit.    SURGICAL HISTORY:  Past Surgical History  Procedure Laterality Date  . Cesarean section    . Laparoscopy      Infertility studies  . Breast biopsy  11/05/2011    Procedure: BREAST BIOPSY WITH NEEDLE LOCALIZATION;  Surgeon: Rolm Bookbinder, MD;  Location:  [facility];  Service: General;  Laterality: Right;  needle localization times two at [facility] 12:30    REVIEW OF SYSTEMS:   A detailed 10 point review of systems is been assessed and pertinent symptoms are mentioned in history of present illness  HEALTH MAINTENANCE: Up-to-date with her colonoscopies, Pap smears and mammograms  PHYSICAL EXAMINATION: Blood pressure 105/63, pulse 77, temperature 98.5 F (36.9 C), temperature source Oral, resp. rate 18, height 5' 6.5" (1.689 m), weight 131 lb 9.6 oz (59.693 kg). Body mass index is 20.92 kg/(m^2). ECOG PERFORMANCE STATUS: 0 - Asymptomatic   General appearance: alert, cooperative and appears stated age Resp: clear to auscultation bilaterally Cardio: regular rate and rhythm GI: soft, non-tender; bowel sounds normal; no masses,  no organomegaly Genitalia: defer exam Neurologic: Grossly normal  bilateral breast examination:  right breast no masses or nipple discharge well-healed surgical scar from lumpectomy. Left breast no masses or nipple discharge.No bilateral axillary lymphadenopathy noted  LABORATORY DATA: Lab Results  Component Value Date   WBC 4.9 06/16/2014   HGB 12.4 06/16/2014   HCT 38.4 06/16/2014   MCV 96.6 06/16/2014   PLT 154 06/16/2014      Chemistry      Component Value Date/Time   NA 145 12/05/2013 0907   NA 140 06/01/2012 1559   K 4.4 12/05/2013 0907   K 4.0 06/01/2012 1559   CL 106 06/03/2013 1041   CL 104 06/01/2012 1559   CO2 27 12/05/2013 0907   CO2 27 06/01/2012 1559   BUN 11.3 12/05/2013 0907   BUN 13 06/01/2012 1559   CREATININE 0.8 12/05/2013 0907   CREATININE 0.75 06/01/2012 1559      Component Value Date/Time   CALCIUM 9.6 12/05/2013 0907   CALCIUM 8.9 06/01/2012 1559   ALKPHOS 36* 12/05/2013 0907   ALKPHOS 49 06/01/2012 1559   AST 20 12/05/2013 0907   AST 20 06/01/2012 1559   ALT 17 12/05/2013 0907   ALT 22 06/01/2012 1559   BILITOT 0.48 12/05/2013 0907   BILITOT 0.2* 06/01/2012 1559      FINAL DIAGNOSIS Diagnosis Breast, lumpectomy, Right - FOCAL LOW GRADE DUCTAL CARCINOMA IN SITU - BACKGROUND OF COMPLEX SCLEROSING LESION ASSOCIATED WITH MICROCALCIFICATION AND PREVIOUS BIOPSY SITE CHANGES. - RESECTION MARGINS ARE NEGATIVE FOR ATYPIA OR MALIGNANCY. Microscopic Comment BREAST, IN SITU CARCINOMA Specimen, including laterality: Right breast. Procedure: Lumpectomy Lymph node sampling performed: No. Estimated tumor size (glass slide measurement): 0.2 cm Histologic type: Ductal carcinoma in situ. Nuclear grade: Low grade. Necrosis: No. Treatment effect (if treated with neoadjuvant therapy): No Distance to closest margin: Inferior margin, 0.2 cm Breast Prognostic Profile: will be performed and addendum report will follow. Axillary lymph nodes: N/A TNM: pTis, pNX Comments: There is a microscopic focus of low grade ductal carcinoma in situ identified in a background of complex sclerosing lesion with associated microcalcifications. Previous excisional site changes are also present. The tumor is 0.2 cm from the inked inferior margin. ER/PR will be performed and an addendum report  will follow. (HCL:gt, 11/07/11) Aldona Bar MD Pathologist, Electronic Signature (Case signed 11/07/2011) PROGNOSTIC INDICATORS - ACIS 1 of 3 FINAL for [place], [place] [phone]) (continued) Results IMMUNOHISTOCHEMICAL AND MORPHOMETRIC ANALYSIS BY THE AUTOMATED CELLULAR IMAGING SYSTEM (ACIS) Estrogen Receptor (Negative, <1%): 82%, STRONG STAINING INTENSITY Progesterone Receptor (Negative, <1%): 72%, MODERATE STAINING INTENSITY All controls stained appropriately Enid Cutter MD Pathologist, Electronic Signature    ASSESSMENT/PLAN:   #1 History of DCIS in the background of complex sclerosing lesion. The DCIS only measure 0.2 cm. It was ER +82% PR +72%. Post lumpectomy patient underwent radiation therapy. In June 2013 she began tamoxifen 20 mg as a adjuvant therapy. Her mammogram  performed in November 2015 revealed no evidence of any malignancy. I have reviewed her CBC and differential and CMPand those are within normal range.  Continue tamoxifen 20 mg by mouth once daily for total of 5 years.  Follow up with gynecology for  annual pelvic and Pap smears while she is on tamoxifen therapy  Next follow up visit in 6 months with CBC differential and CMP. We will schedule for annual diagnostic mammogram in 6 months  All questions were answered. The patient knows to call the clinic with any problems, questions or concerns. We can certainly see the patient much sooner if necessary.  I spent 80minutes counseling the patient face to face. The total time spent in the appointment was 30 minutes.   Wilmon Arms, MD Medical/Oncology [facility] [phone] (Office)  06/16/2014, 3:02 PM

## 2014-06-16 NOTE — Telephone Encounter (Signed)
Error

## 2014-06-16 NOTE — Telephone Encounter (Signed)
Faxed office notes from 06/16/14 to Dr. Dellis Filbert at [facility].  Sent to scan.

## 2014-06-16 NOTE — Telephone Encounter (Signed)
[facility] PT APPT SCHEDULE FOR DEC AND NOV MAMMO @ [facility]

## 2014-10-23 ENCOUNTER — Encounter: Payer: Self-pay | Admitting: Oncology

## 2014-10-31 ENCOUNTER — Ambulatory Visit
Admission: RE | Admit: 2014-10-31 | Discharge: 2014-10-31 | Disposition: A | Discharge status: Home / Self Care | Payer: BC Managed Care – PPO | Source: Ambulatory Visit | Attending: Hematology and Oncology | Admitting: Hematology and Oncology

## 2014-10-31 DIAGNOSIS — D0501 Lobular carcinoma in situ of right breast: Secondary | ICD-10-CM

## 2014-12-18 ENCOUNTER — Telehealth: Payer: Self-pay | Admitting: Hematology and Oncology

## 2014-12-18 ENCOUNTER — Other Ambulatory Visit (HOSPITAL_BASED_OUTPATIENT_CLINIC_OR_DEPARTMENT_OTHER): Payer: BC Managed Care – PPO

## 2014-12-18 ENCOUNTER — Other Ambulatory Visit: Payer: Self-pay

## 2014-12-18 ENCOUNTER — Ambulatory Visit (HOSPITAL_BASED_OUTPATIENT_CLINIC_OR_DEPARTMENT_OTHER): Payer: BC Managed Care – PPO | Admitting: Hematology and Oncology

## 2014-12-18 VITALS — BP 115/55 | HR 75 | Temp 98.2°F | Resp 20 | Ht 66.5 in | Wt 131.2 lb

## 2014-12-18 DIAGNOSIS — D0501 Lobular carcinoma in situ of right breast: Secondary | ICD-10-CM

## 2014-12-18 DIAGNOSIS — D0510 Intraductal carcinoma in situ of unspecified breast: Secondary | ICD-10-CM

## 2014-12-18 DIAGNOSIS — D0511 Intraductal carcinoma in situ of right breast: Secondary | ICD-10-CM

## 2014-12-18 LAB — COMPREHENSIVE METABOLIC PANEL (CC13)
ALBUMIN: 4.1 g/dL (ref 3.5–5.0)
ALT: 20 U/L (ref 0–55)
ANION GAP: 8 meq/L (ref 3–11)
AST: 20 U/L (ref 5–34)
Alkaline Phosphatase: 40 U/L (ref 40–150)
BUN: 11.2 mg/dL (ref 7.0–26.0)
CO2: 29 meq/L (ref 22–29)
Calcium: 9.2 mg/dL (ref 8.4–10.4)
Chloride: 106 mEq/L (ref 98–109)
Creatinine: 0.8 mg/dL (ref 0.6–1.1)
EGFR: 85 mL/min/{1.73_m2} — ABNORMAL LOW (ref >90)
Glucose: 94 mg/dl (ref 70–140)
Potassium: 4.3 mEq/L (ref 3.5–5.1)
SODIUM: 144 meq/L (ref 136–145)
TOTAL PROTEIN: 6.8 g/dL (ref 6.4–8.3)
Total Bilirubin: 0.45 mg/dL (ref 0.20–1.20)

## 2014-12-18 LAB — CBC WITH DIFFERENTIAL/PLATELET
BASO%: 0.7 % (ref 0.0–2.0)
Basophils Absolute: 0 10*3/uL (ref 0.0–0.1)
EOS%: 1.3 % (ref 0.0–7.0)
Eosinophils Absolute: 0.1 10*3/uL (ref 0.0–0.5)
HCT: 41.5 % (ref 34.8–46.6)
HGB: 13.3 g/dL (ref 11.6–15.9)
LYMPH%: 19.5 % (ref 14.0–49.7)
MCH: 30.7 pg (ref 25.1–34.0)
MCHC: 32.1 g/dL (ref 31.5–36.0)
MCV: 95.5 fL (ref 79.5–101.0)
MONO#: 0.4 10*3/uL (ref 0.1–0.9)
MONO%: 6.3 % (ref 0.0–14.0)
NEUT#: 4.3 10*3/uL (ref 1.5–6.5)
NEUT%: 72.2 % (ref 38.4–76.8)
Platelets: 168 10*3/uL (ref 145–400)
RBC: 4.34 10*6/uL (ref 3.70–5.45)
RDW: 13.1 % (ref 11.2–14.5)
WBC: 5.9 10*3/uL (ref 3.9–10.3)
lymph#: 1.2 10*3/uL (ref 0.9–3.3)

## 2014-12-18 NOTE — Telephone Encounter (Signed)
per pof to sch pt appt-gave pt copy of sch °

## 2014-12-18 NOTE — Progress Notes (Signed)
Patient Care Team: Marjorie Smolder, MD as PCP - General (Family Medicine)  DIAGNOSIS: Ductal carcinoma in situ (DCIS) of right breast   Staging form: Breast, AJCC 7th Edition     Pathologic: Stage 0 (Tis (DCIS), N0, cM0) - Signed by Thea Silversmith, MD on 03/18/2012  PRIOR THERAPY: From Original intake note:  #1 patient at the age of 53 went for a screening mammogram in October 2012. This showed 3 areas of calcifications in the lateral aspect of the right breast. Magnification views showed a cluster of pleomorphic calcifications in the anterior 12:00 position. Other areas on magnification views appear benign.  #2 stereotactic biopsy performed of these pleomorphic calcification showed atypical ductal hyperplasia and lobular carcinoma in situ arising in a radial scar. MRI of the breasts bilaterally performed on 10/17/2011 showed nodular non-masslike enhancement measuring 1.9 x 1.5 centimeters. A 2.4 x 1.6 x 1.6 area of non-mass like enhancement was also noted in the upper outer quadrant of the right breast at the location of the additional microcalcifications concerning for LCIS. She therefore underwent MRI guided biopsy on 10/24/2011, the pathology revealed a complex sclerosing lesion with atypical lobular hyperplasia. Excision was recommended.  #3 on 11/05/2011 patient had excision of both of these areas. She was noted to have ductal carcinoma in situ on the background of complex sclerosing lesion with microcalcifications and previous biopsy changes. The closest margin was inferiorly at 0.2 cm.  #4 she was seen by Dr. Thea Silversmith who recommended radiation therapy and she began this starting 01/12/2012 through 02/24/2012.   #5 patient was also seen by Dr. Eston Esters who recommended adjuvant antiestrogen therapy with tamoxifen 20 mg daily starting in June 2013. A plan of 5 years of therapy was recommended.   CURRENT THERAPY: tamoxifen 20 mg daily since June 2013  CHIEF COMPLIANT: Follow-up  of DCIS  INTERVAL HISTORY: Connie Jones is a 53 year old lady with above-mentioned history of DCIS who is currently on antiestrogen therapy with tamoxifen since June 2013. She is tolerating it extremely well without any major problems or concerns. She does have occasional hot flashes especially at nighttime. Denies any myalgias. Denies any vaginal bleeding. She had a mammogram November 2015 which is normal.  REVIEW OF SYSTEMS:   Constitutional: Denies fevers, chills or abnormal weight loss Eyes: Denies blurriness of vision Ears, nose, mouth, throat, and face: Denies mucositis or sore throat Respiratory: Denies cough, dyspnea or wheezes Cardiovascular: Denies palpitation, chest discomfort or lower extremity swelling Gastrointestinal:  Denies nausea, heartburn or change in bowel habits Skin: Denies abnormal skin rashes Lymphatics: Denies new lymphadenopathy or easy bruising Neurological:Denies numbness, tingling or new weaknesses Behavioral/Psych: Mood is stable, no new changes  Breast:  denies any pain or lumps or nodules in either breasts All other systems were reviewed with the patient and are negative.  I have reviewed the past medical history, past surgical history, social history and family history with the patient and they are unchanged from previous note.  ALLERGIES:  has No Known Allergies.  MEDICATIONS:  Current Outpatient Prescriptions  Medication Sig Dispense Refill  . calcium citrate-vitamin D 200-200 MG-UNIT TABS Take 2 tablets by mouth 2 (two) times daily.      . fish oil-omega-3 fatty acids 1000 MG capsule Take 2 g by mouth daily.    Marland Kitchen glucosamine-chondroitin 500-400 MG tablet Take 2 tablets by mouth daily.     . Multiple Vitamins-Minerals (MULTIVITAMIN WITH MINERALS) tablet Take 1 tablet by mouth daily.      Marland Kitchen  Probiotic Product (PROBIOTIC PO) Take by mouth daily.    . tamoxifen (NOLVADEX) 20 MG tablet Take 1 tablet (20 mg total) by mouth daily. 90 tablet 12  . vitamin E  1000 UNIT capsule Take 1,000 Units by mouth daily.     No current facility-administered medications for this visit.    PHYSICAL EXAMINATION: ECOG PERFORMANCE STATUS: 0 - Asymptomatic  Filed Vitals:   12/18/14 1405  BP: 115/55  Pulse: 75  Temp: 98.2 F (36.8 C)  Resp: 20   Filed Weights   12/18/14 1405  Weight: 131 lb 3.2 oz (59.512 kg)    GENERAL:alert, no distress and comfortable SKIN: skin color, texture, turgor are normal, no rashes or significant lesions EYES: normal, Conjunctiva are pink and non-injected, sclera clear OROPHARYNX:no exudate, no erythema and lips, buccal mucosa, and tongue normal  NECK: supple, thyroid normal size, non-tender, without nodularity LYMPH:  no palpable lymphadenopathy in the cervical, axillary or inguinal LUNGS: clear to auscultation and percussion with normal breathing effort HEART: regular rate & rhythm and no murmurs and no lower extremity edema ABDOMEN:abdomen soft, non-tender and normal bowel sounds Musculoskeletal:no cyanosis of digits and no clubbing  NEURO: alert & oriented x 3 with fluent speech, no focal motor/sensory deficits BREAST: No palpable masses or nodules in either right or left breasts. No palpable axillary supraclavicular or infraclavicular adenopathy no breast tenderness or nipple discharge.   LABORATORY DATA:  I have reviewed the data as listed   Chemistry      Component Value Date/Time   NA 142 06/16/2014 1421   NA 140 06/01/2012 1559   K 4.3 06/16/2014 1421   K 4.0 06/01/2012 1559   CL 106 06/03/2013 1041   CL 104 06/01/2012 1559   CO2 27 06/16/2014 1421   CO2 27 06/01/2012 1559   BUN 12.1 06/16/2014 1421   BUN 13 06/01/2012 1559   CREATININE 0.8 06/16/2014 1421   CREATININE 0.75 06/01/2012 1559      Component Value Date/Time   CALCIUM 9.2 06/16/2014 1421   CALCIUM 8.9 06/01/2012 1559   ALKPHOS 34* 06/16/2014 1421   ALKPHOS 49 06/01/2012 1559   AST 19 06/16/2014 1421   AST 20 06/01/2012 1559   ALT 13  06/16/2014 1421   ALT 22 06/01/2012 1559   BILITOT 0.46 06/16/2014 1421   BILITOT 0.2* 06/01/2012 1559       Lab Results  Component Value Date   WBC 5.9 12/18/2014   HGB 13.3 12/18/2014   HCT 41.5 12/18/2014   MCV 95.5 12/18/2014   PLT 168 12/18/2014   NEUTROABS 4.3 12/18/2014     RADIOGRAPHIC STUDIES: I have personally reviewed the radiology reports and agreed with their findings. Mammogram November 2015 normal  ASSESSMENT & PLAN:  Ductal carcinoma in situ (DCIS) of right breast Right breast DCIS diagnosed 10/24/2011 status post lumpectomy 11/05/2011 DCIS in a background of complex sclerosing lesion status post radiation therapy completed 02/24/2012, currently on adjuvant tamoxifen 20 mg daily started June 2013  Tamoxifen toxicities: 1. Occasional hot flashes  Breast cancer surveillance: 1. Breast exam 12/10/2014 normal 2. Mammogram 10/31/2014 is normal  Return to clinic once a year for follow-up    No orders of the defined types were placed in this encounter.   The patient has a good understanding of the overall plan. she agrees with it. She will call with any problems that may develop before her next visit here.   Rulon Eisenmenger, MD 12/18/2014 2:12 PM

## 2014-12-18 NOTE — Assessment & Plan Note (Addendum)
Right breast DCIS diagnosed 10/24/2011 status post lumpectomy 11/05/2011 DCIS in a background of complex sclerosing lesion status post radiation therapy completed 02/24/2012, currently on adjuvant tamoxifen 20 mg daily started June 2013  Tamoxifen toxicities: 1. Occasional hot flashes  Breast cancer surveillance: 1. Breast exam 12/10/2014 normal 2. Mammogram 10/31/2014 is normal  Return to clinic once a year for follow-up

## 2015-07-20 ENCOUNTER — Other Ambulatory Visit: Payer: Self-pay | Admitting: Family Medicine

## 2015-07-20 DIAGNOSIS — Z853 Personal history of malignant neoplasm of breast: Secondary | ICD-10-CM

## 2015-08-29 ENCOUNTER — Other Ambulatory Visit: Payer: Self-pay | Admitting: Hematology and Oncology

## 2015-08-31 ENCOUNTER — Telehealth: Payer: Self-pay | Admitting: *Deleted

## 2015-08-31 DIAGNOSIS — D0501 Lobular carcinoma in situ of right breast: Secondary | ICD-10-CM

## 2015-08-31 MED ORDER — TAMOXIFEN CITRATE 20 MG PO TABS: 20.0000 mg | ORAL_TABLET | Freq: Every day | ORAL | Status: DC

## 2015-08-31 NOTE — Telephone Encounter (Signed)
TC from [facility] requesting refill authorization for Tamoxifen.

## 2015-08-31 NOTE — Telephone Encounter (Signed)
REFILL REQUESTED.

## 2015-11-05 ENCOUNTER — Ambulatory Visit
Admission: RE | Admit: 2015-11-05 | Discharge: 2015-11-05 | Disposition: A | Discharge status: Home / Self Care | Payer: PRIVATE HEALTH INSURANCE | Source: Ambulatory Visit | Attending: Family Medicine | Admitting: Family Medicine

## 2015-11-05 DIAGNOSIS — Z853 Personal history of malignant neoplasm of breast: Secondary | ICD-10-CM

## 2015-12-18 NOTE — Assessment & Plan Note (Signed)
Right breast DCIS diagnosed 10/24/2011 status post lumpectomy 11/05/2011 DCIS in a background of complex sclerosing lesion status post radiation therapy completed 02/24/2012, currently on adjuvant tamoxifen 20 mg daily started June 2013  Tamoxifen toxicities: 1. Occasional hot flashes  Breast cancer surveillance: 1. Breast exam 12/19/2015 normal 2. Mammogram 11/05/2015 is normal breast density category C  Return to clinic once a year for follow-up

## 2015-12-19 ENCOUNTER — Other Ambulatory Visit: Payer: Self-pay | Admitting: *Deleted

## 2015-12-19 ENCOUNTER — Telehealth: Payer: Self-pay | Admitting: Hematology and Oncology

## 2015-12-19 ENCOUNTER — Ambulatory Visit (HOSPITAL_BASED_OUTPATIENT_CLINIC_OR_DEPARTMENT_OTHER): Payer: PRIVATE HEALTH INSURANCE | Admitting: Hematology and Oncology

## 2015-12-19 ENCOUNTER — Other Ambulatory Visit (HOSPITAL_BASED_OUTPATIENT_CLINIC_OR_DEPARTMENT_OTHER): Payer: PRIVATE HEALTH INSURANCE

## 2015-12-19 ENCOUNTER — Encounter: Payer: Self-pay | Admitting: Hematology and Oncology

## 2015-12-19 VITALS — BP 105/50 | HR 79 | Temp 98.2°F | Resp 18 | Ht 66.5 in | Wt 133.2 lb

## 2015-12-19 DIAGNOSIS — Z86000 Personal history of in-situ neoplasm of breast: Secondary | ICD-10-CM

## 2015-12-19 DIAGNOSIS — D0501 Lobular carcinoma in situ of right breast: Secondary | ICD-10-CM

## 2015-12-19 DIAGNOSIS — D0511 Intraductal carcinoma in situ of right breast: Secondary | ICD-10-CM

## 2015-12-19 LAB — CBC WITH DIFFERENTIAL/PLATELET
BASO%: 1.1 % (ref 0.0–2.0)
BASOS ABS: 0.1 10*3/uL (ref 0.0–0.1)
EOS ABS: 0.1 10*3/uL (ref 0.0–0.5)
EOS%: 1.6 % (ref 0.0–7.0)
HEMATOCRIT: 41.7 % (ref 34.8–46.6)
HGB: 13.6 g/dL (ref 11.6–15.9)
LYMPH#: 1.1 10*3/uL (ref 0.9–3.3)
LYMPH%: 21.6 % (ref 14.0–49.7)
MCH: 31.2 pg (ref 25.1–34.0)
MCHC: 32.6 g/dL (ref 31.5–36.0)
MCV: 95.6 fL (ref 79.5–101.0)
MONO#: 0.4 10*3/uL (ref 0.1–0.9)
MONO%: 8 % (ref 0.0–14.0)
NEUT#: 3.5 10*3/uL (ref 1.5–6.5)
NEUT%: 67.7 % (ref 38.4–76.8)
PLATELETS: 160 10*3/uL (ref 145–400)
RBC: 4.36 10*6/uL (ref 3.70–5.45)
RDW: 13.7 % (ref 11.2–14.5)
WBC: 5.1 10*3/uL (ref 3.9–10.3)

## 2015-12-19 LAB — COMPREHENSIVE METABOLIC PANEL
ALBUMIN: 4.1 g/dL (ref 3.5–5.0)
ALK PHOS: 39 U/L — AB (ref 40–150)
ALT: 15 U/L (ref 0–55)
AST: 19 U/L (ref 5–34)
Anion Gap: 8 mEq/L (ref 3–11)
BILIRUBIN TOTAL: 0.46 mg/dL (ref 0.20–1.20)
BUN: 14.4 mg/dL (ref 7.0–26.0)
CO2: 26 meq/L (ref 22–29)
Calcium: 9.1 mg/dL (ref 8.4–10.4)
Chloride: 108 mEq/L (ref 98–109)
Creatinine: 0.8 mg/dL (ref 0.6–1.1)
EGFR: 78 mL/min/{1.73_m2} — ABNORMAL LOW (ref >90)
GLUCOSE: 89 mg/dL (ref 70–140)
POTASSIUM: 4.6 meq/L (ref 3.5–5.1)
SODIUM: 143 meq/L (ref 136–145)
TOTAL PROTEIN: 6.8 g/dL (ref 6.4–8.3)

## 2015-12-19 MED ORDER — TAMOXIFEN CITRATE 20 MG PO TABS: 20.0000 mg | ORAL_TABLET | Freq: Every day | ORAL | Status: DC

## 2015-12-19 NOTE — Telephone Encounter (Signed)
Appointments made and avs printed for patient °

## 2015-12-19 NOTE — Progress Notes (Signed)
Patient Care Team: Darcus Austin, MD as PCP - General (Family Medicine)  DIAGNOSIS: Ductal carcinoma in situ (DCIS) of right breast   Staging form: Breast, AJCC 7th Edition     Pathologic: Stage 0 (Tis (DCIS), N0, cM0) - Signed by Thea Silversmith, MD on 03/18/2012  CHIEF COMPLIANT: follow-up of DCIS on tamoxifen  INTERVAL HISTORY: Connie Jones is a 54 year old with above-mentioned history of DCIS of right breast currently on tamoxifen therapy for the past 3-1/2 years. She appears to be tolerating it relatively well. She does have hot flashes for which she takes vitamin D. Does not seem to be helping her significantly. She describes these hot flashes and mild to moderate and does not seem to be affecting her quality of life. They're not waking her up at night.  REVIEW OF SYSTEMS:   Constitutional: Denies fevers, chills or abnormal weight loss Eyes: Denies blurriness of vision Ears, nose, mouth, throat, and face: Denies mucositis or sore throat Respiratory: Denies cough, dyspnea or wheezes Cardiovascular: Denies palpitation, chest discomfort Gastrointestinal:  Denies nausea, heartburn or change in bowel habits Skin: Denies abnormal skin rashes Lymphatics: Denies new lymphadenopathy or easy bruising Neurological:Denies numbness, tingling or new weaknesses Behavioral/Psych: Mood is stable, no new changes  Extremities: No lower extremity edema Breast:  denies any pain or lumps or nodules in either breasts All other systems were reviewed with the patient and are negative.  I have reviewed the past medical history, past surgical history, social history and family history with the patient and they are unchanged from previous note.  ALLERGIES:  has No Known Allergies.  MEDICATIONS:  Current Outpatient Prescriptions  Medication Sig Dispense Refill  . calcium citrate-vitamin D 200-200 MG-UNIT TABS Take 2 tablets by mouth 2 (two) times daily.      . fish oil-omega-3 fatty acids 1000 MG capsule  Take 2 g by mouth daily.    Marland Kitchen glucosamine-chondroitin 500-400 MG tablet Take 2 tablets by mouth daily.     . Multiple Vitamins-Minerals (MULTIVITAMIN WITH MINERALS) tablet Take 1 tablet by mouth daily.      . Probiotic Product (PROBIOTIC PO) Take by mouth daily.    . tamoxifen (NOLVADEX) 20 MG tablet Take 1 tablet (20 mg total) by mouth daily. 90 tablet 1  . vitamin E 1000 UNIT capsule Take 1,000 Units by mouth daily.     No current facility-administered medications for this visit.    PHYSICAL EXAMINATION: ECOG PERFORMANCE STATUS: 0 - Asymptomatic  Filed Vitals:   12/19/15 1336  BP: 105/50  Pulse: 79  Temp: 98.2 F (36.8 C)  Resp: 18   Filed Weights   12/19/15 1336  Weight: 133 lb 3.2 oz (60.419 kg)    GENERAL:alert, no distress and comfortable SKIN: skin color, texture, turgor are normal, no rashes or significant lesions EYES: normal, Conjunctiva are pink and non-injected, sclera clear OROPHARYNX:no exudate, no erythema and lips, buccal mucosa, and tongue normal  NECK: supple, thyroid normal size, non-tender, without nodularity LYMPH:  no palpable lymphadenopathy in the cervical, axillary or inguinal LUNGS: clear to auscultation and percussion with normal breathing effort HEART: regular rate & rhythm and no murmurs and no lower extremity edema ABDOMEN:abdomen soft, non-tender and normal bowel sounds MUSCULOSKELETAL:no cyanosis of digits and no clubbing  NEURO: alert & oriented x 3 with fluent speech, no focal motor/sensory deficits EXTREMITIES: No lower extremity edema BREAST:6 No palpable masses or nodules in either right or left breasts. No palpable axillary supraclavicular or infraclavicular adenopathy no breast  tenderness or nipple discharge. (exam performed in the presence of a chaperone)  LABORATORY DATA:  I have reviewed the data as listed   Chemistry      Component Value Date/Time   NA 144 12/18/2014 1355   NA 140 06/01/2012 1559   K 4.3 12/18/2014 1355   K  4.0 06/01/2012 1559   CL 106 06/03/2013 1041   CL 104 06/01/2012 1559   CO2 29 12/18/2014 1355   CO2 27 06/01/2012 1559   BUN 11.2 12/18/2014 1355   BUN 13 06/01/2012 1559   CREATININE 0.8 12/18/2014 1355   CREATININE 0.75 06/01/2012 1559      Component Value Date/Time   CALCIUM 9.2 12/18/2014 1355   CALCIUM 8.9 06/01/2012 1559   ALKPHOS 40 12/18/2014 1355   ALKPHOS 49 06/01/2012 1559   AST 20 12/18/2014 1355   AST 20 06/01/2012 1559   ALT 20 12/18/2014 1355   ALT 22 06/01/2012 1559   BILITOT 0.45 12/18/2014 1355   BILITOT 0.2* 06/01/2012 1559       Lab Results  Component Value Date   WBC 5.1 12/19/2015   HGB 13.6 12/19/2015   HCT 41.7 12/19/2015   MCV 95.6 12/19/2015   PLT 160 12/19/2015   NEUTROABS 3.5 12/19/2015     ASSESSMENT & PLAN:  Ductal carcinoma in situ (DCIS) of right breast Right breast DCIS diagnosed 10/24/2011 status post lumpectomy 11/05/2011 DCIS in a background of complex sclerosing lesion status post radiation therapy completed 02/24/2012, currently on adjuvant tamoxifen 20 mg daily started June 2013  Tamoxifen toxicities: 1. Occasional hot flashes  Breast cancer surveillance: 1. Breast exam 12/19/2015 normal 2. Mammogram 11/05/2015 is normal breast density category C I discussed with her the blood work is not necessary for surveillance of breast cancer. For next year onwards we will simply try to obtain a copy of her lab work from her primary care physician's office. Return to clinic once a year for follow-up after she completes 5 years of tamoxifen, she will be then followed up by the survivorship clinic.   No orders of the defined types were placed in this encounter.   The patient has a good understanding of the overall plan. she agrees with it. she will call with any problems that may develop before the next visit here.   Rulon Eisenmenger, MD 12/19/2015

## 2016-08-21 ENCOUNTER — Other Ambulatory Visit: Payer: Self-pay | Admitting: Family Medicine

## 2016-08-21 DIAGNOSIS — Z853 Personal history of malignant neoplasm of breast: Secondary | ICD-10-CM

## 2016-08-26 ENCOUNTER — Other Ambulatory Visit: Payer: Self-pay | Admitting: Family Medicine

## 2016-08-26 DIAGNOSIS — M8580 Other specified disorders of bone density and structure, unspecified site: Secondary | ICD-10-CM

## 2016-09-19 DIAGNOSIS — Z23 Encounter for immunization: Secondary | ICD-10-CM | POA: Diagnosis not present

## 2016-09-24 DIAGNOSIS — Z1151 Encounter for screening for human papillomavirus (HPV): Secondary | ICD-10-CM | POA: Diagnosis not present

## 2016-09-24 DIAGNOSIS — Z01419 Encounter for gynecological examination (general) (routine) without abnormal findings: Secondary | ICD-10-CM | POA: Diagnosis not present

## 2016-09-24 DIAGNOSIS — Z6820 Body mass index (BMI) 20.0-20.9, adult: Secondary | ICD-10-CM | POA: Diagnosis not present

## 2016-11-06 ENCOUNTER — Ambulatory Visit
Admission: RE | Admit: 2016-11-06 | Discharge: 2016-11-06 | Disposition: A | Discharge status: Home / Self Care | Payer: BLUE CROSS/BLUE SHIELD | Source: Ambulatory Visit | Attending: Family Medicine | Admitting: Family Medicine

## 2016-11-06 DIAGNOSIS — M810 Age-related osteoporosis without current pathological fracture: Secondary | ICD-10-CM | POA: Diagnosis not present

## 2016-11-06 DIAGNOSIS — Z780 Asymptomatic menopausal state: Secondary | ICD-10-CM | POA: Diagnosis not present

## 2016-11-06 DIAGNOSIS — R922 Inconclusive mammogram: Secondary | ICD-10-CM | POA: Diagnosis not present

## 2016-11-06 DIAGNOSIS — M8580 Other specified disorders of bone density and structure, unspecified site: Secondary | ICD-10-CM

## 2016-11-06 DIAGNOSIS — Z853 Personal history of malignant neoplasm of breast: Secondary | ICD-10-CM

## 2016-11-17 DIAGNOSIS — M810 Age-related osteoporosis without current pathological fracture: Secondary | ICD-10-CM | POA: Diagnosis not present

## 2016-11-23 ENCOUNTER — Telehealth: Payer: Self-pay | Admitting: Hematology and Oncology

## 2016-11-23 NOTE — Telephone Encounter (Signed)
Lvm advising appt [facility] from 12/28 to 12/25/16 @ 2.15pm due to md pal.

## 2016-12-18 ENCOUNTER — Ambulatory Visit: Payer: PRIVATE HEALTH INSURANCE | Admitting: Hematology and Oncology

## 2016-12-24 NOTE — Assessment & Plan Note (Signed)
Right breast DCIS diagnosed 10/24/2011 status post lumpectomy 11/05/2011 DCIS in a background of complex sclerosing lesion status post radiation therapy completed 02/24/2012, currently on adjuvant tamoxifen 20 mg daily started June 2013  Tamoxifen toxicities: 1. Occasional hot flashes Patient will complete Tamoxifen as of June 2018  Breast cancer surveillance: 1. Breast exam 12/26/15 normal 2. Mammogram 11/10/16 is normal breast density category C  Return to clinic once a year for follow-up

## 2016-12-25 ENCOUNTER — Ambulatory Visit (HOSPITAL_BASED_OUTPATIENT_CLINIC_OR_DEPARTMENT_OTHER): Payer: BLUE CROSS/BLUE SHIELD | Admitting: Hematology and Oncology

## 2016-12-25 ENCOUNTER — Encounter: Payer: Self-pay | Admitting: Hematology and Oncology

## 2016-12-25 DIAGNOSIS — D0511 Intraductal carcinoma in situ of right breast: Secondary | ICD-10-CM | POA: Diagnosis not present

## 2016-12-25 DIAGNOSIS — D0501 Lobular carcinoma in situ of right breast: Secondary | ICD-10-CM

## 2016-12-25 DIAGNOSIS — Z79810 Long term (current) use of selective estrogen receptor modulators (SERMs): Secondary | ICD-10-CM

## 2016-12-25 MED ORDER — TAMOXIFEN CITRATE 20 MG PO TABS: 20.0000 mg | ORAL_TABLET | Freq: Every day | ORAL | 1 refills | Status: DC

## 2016-12-25 MED ORDER — ALENDRONATE SODIUM 70 MG PO TABS: 70.0000 mg | ORAL_TABLET | ORAL | Status: DC

## 2016-12-25 NOTE — Progress Notes (Signed)
Patient Care Team: Darcus Austin, MD as PCP - General (Family Medicine)  DIAGNOSIS: Ductal carcinoma in situ (DCIS) of right breast   Staging form: Breast, AJCC 7th Edition     Pathologic: Stage 0 (Tis (DCIS), N0, cM0) - Signed by Thea Silversmith, MD on 03/18/2012  CHIEF COMPLIANT: follow-up of DCIS on tamoxifen  INTERVAL HISTORY: Connie Jones is a 56 year old with above-mentioned history of DCIS of right breast currently on tamoxifen therapy for the past 4-1/2 years. She appears to be tolerating it relatively well. She does have hot flashes for which she takes vitamin D. She describes these hot flashes and mild to moderate and does not seem to be affecting her quality of life.   REVIEW OF SYSTEMS:   Constitutional: Denies fevers, chills or abnormal weight loss Eyes: Denies blurriness of vision Ears, nose, mouth, throat, and face: Denies mucositis or sore throat Respiratory: Denies cough, dyspnea or wheezes Cardiovascular: Denies palpitation, chest discomfort Gastrointestinal:  Denies nausea, heartburn or change in bowel habits Skin: Denies abnormal skin rashes Lymphatics: Denies new lymphadenopathy or easy bruising Neurological:Denies numbness, tingling or new weaknesses Behavioral/Psych: Mood is stable, no new changes  Extremities: No lower extremity edema Breast:  denies any pain or lumps or nodules in either breasts All other systems were reviewed with the patient and are negative.  I have reviewed the past medical history, past surgical history, social history and family history with the patient and they are unchanged from previous note.  ALLERGIES:  has No Known Allergies.  MEDICATIONS:  Current Outpatient Prescriptions  Medication Sig Dispense Refill  . alendronate (FOSAMAX) 70 MG tablet Take 1 tablet (70 mg total) by mouth once a week. Take with a full glass of water on an empty stomach.    . calcium citrate-vitamin D 200-200 MG-UNIT TABS Take 2 tablets by mouth 2 (two)  times daily.      . fish oil-omega-3 fatty acids 1000 MG capsule Take 2 g by mouth daily.    Marland Kitchen glucosamine-chondroitin 500-400 MG tablet Take 2 tablets by mouth daily.     . Multiple Vitamins-Minerals (MULTIVITAMIN WITH MINERALS) tablet Take 1 tablet by mouth daily.      . Probiotic Product (PROBIOTIC PO) Take by mouth daily.    . tamoxifen (NOLVADEX) 20 MG tablet Take 1 tablet (20 mg total) by mouth daily. 90 tablet 1  . vitamin E 1000 UNIT capsule Take 1,000 Units by mouth daily.     No current facility-administered medications for this visit.     PHYSICAL EXAMINATION: ECOG PERFORMANCE STATUS: 1 - Symptomatic but completely ambulatory  Vitals:   12/25/16 1435  BP: (!) 102/52  Pulse: 81  Resp: 18  Temp: 98.6 F (37 C)   Filed Weights   12/25/16 1435  Weight: 132 lb (59.9 kg)    GENERAL:alert, no distress and comfortable SKIN: skin color, texture, turgor are normal, no rashes or significant lesions EYES: normal, Conjunctiva are pink and non-injected, sclera clear OROPHARYNX:no exudate, no erythema and lips, buccal mucosa, and tongue normal  NECK: supple, thyroid normal size, non-tender, without nodularity LYMPH:  no palpable lymphadenopathy in the cervical, axillary or inguinal LUNGS: clear to auscultation and percussion with normal breathing effort HEART: regular rate & rhythm and no murmurs and no lower extremity edema ABDOMEN:abdomen soft, non-tender and normal bowel sounds MUSCULOSKELETAL:no cyanosis of digits and no clubbing  NEURO: alert & oriented x 3 with fluent speech, no focal motor/sensory deficits EXTREMITIES: No lower extremity edema  LABORATORY  DATA:  I have reviewed the data as listed   Chemistry      Component Value Date/Time   NA 143 12/19/2015 1325   K 4.6 12/19/2015 1325   CL 106 06/03/2013 1041   CO2 26 12/19/2015 1325   BUN 14.4 12/19/2015 1325   CREATININE 0.8 12/19/2015 1325      Component Value Date/Time   CALCIUM 9.1 12/19/2015 1325    ALKPHOS 39 (L) 12/19/2015 1325   AST 19 12/19/2015 1325   ALT 15 12/19/2015 1325   BILITOT 0.46 12/19/2015 1325       Lab Results  Component Value Date   WBC 5.1 12/19/2015   HGB 13.6 12/19/2015   HCT 41.7 12/19/2015   MCV 95.6 12/19/2015   PLT 160 12/19/2015   NEUTROABS 3.5 12/19/2015    ASSESSMENT & PLAN:  Ductal carcinoma in situ (DCIS) of right breast Right breast DCIS diagnosed 10/24/2011 status post lumpectomy 11/05/2011 DCIS in a background of complex sclerosing lesion status post radiation therapy completed 02/24/2012, currently on adjuvant tamoxifen 20 mg daily started June 2013  Tamoxifen toxicities: 1. Occasional hot flashes Patient will complete Tamoxifen as of June 2018. She will stop it at that time. I discussed with her that her risk of recurrence after completion of antiestrogen therapy would be around 2-3%.  Breast cancer surveillance: 1. Breast exam normal 2. Mammogram 11/10/16 is normal breast density category C  Return to clinic once a year for follow-up With survivorship clinic   Orders Placed This Encounter  Procedures  . Amb Referral to Survivorship Long term    Referral Priority:   Routine    Referral Type:   Consultation    Number of Visits Requested:   1   The patient has a good understanding of the overall plan. she agrees with it. she will call with any problems that may develop before the next visit here.   Rulon Eisenmenger, MD 12/25/16

## 2017-02-26 ENCOUNTER — Other Ambulatory Visit: Payer: Self-pay | Admitting: *Deleted

## 2017-02-26 DIAGNOSIS — D0501 Lobular carcinoma in situ of right breast: Secondary | ICD-10-CM

## 2017-02-26 MED ORDER — TAMOXIFEN CITRATE 20 MG PO TABS: 20.0000 mg | ORAL_TABLET | Freq: Every day | ORAL | 3 refills | Status: DC

## 2017-03-23 DIAGNOSIS — Z13220 Encounter for screening for lipoid disorders: Secondary | ICD-10-CM | POA: Diagnosis not present

## 2017-03-23 DIAGNOSIS — M810 Age-related osteoporosis without current pathological fracture: Secondary | ICD-10-CM | POA: Diagnosis not present

## 2017-03-23 DIAGNOSIS — Z0000 Encounter for general adult medical examination without abnormal findings: Secondary | ICD-10-CM | POA: Diagnosis not present

## 2017-09-24 ENCOUNTER — Other Ambulatory Visit: Payer: Self-pay | Admitting: Family Medicine

## 2017-09-24 DIAGNOSIS — Z853 Personal history of malignant neoplasm of breast: Secondary | ICD-10-CM

## 2017-09-29 ENCOUNTER — Encounter: Payer: Self-pay | Admitting: Obstetrics & Gynecology

## 2017-09-29 ENCOUNTER — Ambulatory Visit (INDEPENDENT_AMBULATORY_CARE_PROVIDER_SITE_OTHER): Payer: BLUE CROSS/BLUE SHIELD | Admitting: Obstetrics & Gynecology

## 2017-09-29 VITALS — BP 106/70 | Ht 66.5 in | Wt 143.0 lb

## 2017-09-29 DIAGNOSIS — Z01419 Encounter for gynecological examination (general) (routine) without abnormal findings: Secondary | ICD-10-CM

## 2017-09-29 DIAGNOSIS — D0511 Intraductal carcinoma in situ of right breast: Secondary | ICD-10-CM | POA: Diagnosis not present

## 2017-09-29 DIAGNOSIS — Z780 Asymptomatic menopausal state: Secondary | ICD-10-CM | POA: Diagnosis not present

## 2017-09-29 DIAGNOSIS — M810 Age-related osteoporosis without current pathological fracture: Secondary | ICD-10-CM | POA: Diagnosis not present

## 2017-09-29 NOTE — Patient Instructions (Signed)
1. Well female exam with routine gynecological exam Normal gyn exam.  Pap neg/HPV HR neg 09/2016.   2. Menopause present No HRT.  No PMB.  3. Age-related osteoporosis without current pathological fracture On Fosamax.  Vit D supplements.  Ca++ in nutrition.  Weight bearing physical activity.  4. Ductal carcinoma in situ (DCIS) of right breast D/ced Tamoxifen 05/2017.  Mammo scheduled for 10/2017.  Connie Jones, it was a pleasure to see you today!   Health Maintenance for Postmenopausal Women Menopause is a normal process in which your reproductive ability comes to an end. This process happens gradually over a span of months to years, usually between the ages of 58 and 16. Menopause is complete when you have missed 12 consecutive menstrual periods. It is important to talk with your health care provider about some of the most common conditions that affect postmenopausal women, such as heart disease, cancer, and bone loss (osteoporosis). Adopting a healthy lifestyle and getting preventive care can help to promote your health and wellness. Those actions can also lower your chances of developing some of these common conditions. What should I know about menopause? During menopause, you may experience a number of symptoms, such as:  Moderate-to-severe hot flashes.  Night sweats.  Decrease in sex drive.  Mood swings.  Headaches.  Tiredness.  Irritability.  Memory problems.  Insomnia.  Choosing to treat or not to treat menopausal changes is an individual decision that you make with your health care provider. What should I know about hormone replacement therapy and supplements? Hormone therapy products are effective for treating symptoms that are associated with menopause, such as hot flashes and night sweats. Hormone replacement carries certain risks, especially as you become older. If you are thinking about using estrogen or estrogen with progestin treatments, discuss the benefits and risks  with your health care provider. What should I know about heart disease and stroke? Heart disease, heart attack, and stroke become more likely as you age. This may be due, in part, to the hormonal changes that your body experiences during menopause. These can affect how your body processes dietary fats, triglycerides, and cholesterol. Heart attack and stroke are both medical emergencies. There are many things that you can do to help prevent heart disease and stroke:  Have your blood pressure checked at least every 1-2 years. High blood pressure causes heart disease and increases the risk of stroke.  If you are 62-50 years old, ask your health care provider if you should take aspirin to prevent a heart attack or a stroke.  Do not use any tobacco products, including cigarettes, chewing tobacco, or electronic cigarettes. If you need help quitting, ask your health care provider.  It is important to eat a healthy diet and maintain a healthy weight. ? Be sure to include plenty of vegetables, fruits, low-fat dairy products, and lean protein. ? Avoid eating foods that are high in solid fats, added sugars, or salt (sodium).  Get regular exercise. This is one of the most important things that you can do for your health. ? Try to exercise for at least 150 minutes each week. The type of exercise that you do should increase your heart rate and make you sweat. This is known as moderate-intensity exercise. ? Try to do strengthening exercises at least twice each week. Do these in addition to the moderate-intensity exercise.  Know your numbers.Ask your health care provider to check your cholesterol and your blood glucose. Continue to have your blood tested as directed  by your health care provider.  What should I know about cancer screening? There are several types of cancer. Take the following steps to reduce your risk and to catch any cancer development as early as possible. Breast Cancer  Practice breast  self-awareness. ? This means understanding how your breasts normally appear and feel. ? It also means doing regular breast self-exams. Let your health care provider know about any changes, no matter how small.  If you are 2 or older, have a clinician do a breast exam (clinical breast exam or CBE) every year. Depending on your age, family history, and medical history, it may be recommended that you also have a yearly breast X-ray (mammogram).  If you have a family history of breast cancer, talk with your health care provider about genetic screening.  If you are at high risk for breast cancer, talk with your health care provider about having an MRI and a mammogram every year.  Breast cancer (BRCA) gene test is recommended for women who have family members with BRCA-related cancers. Results of the assessment will determine the need for genetic counseling and BRCA1 and for BRCA2 testing. BRCA-related cancers include these types: ? Breast. This occurs in males or females. ? Ovarian. ? Tubal. This may also be called fallopian tube cancer. ? Cancer of the abdominal or pelvic lining (peritoneal cancer). ? Prostate. ? Pancreatic.  Cervical, Uterine, and Ovarian Cancer Your health care provider may recommend that you be screened regularly for cancer of the pelvic organs. These include your ovaries, uterus, and vagina. This screening involves a pelvic exam, which includes checking for microscopic changes to the surface of your cervix (Pap test).  For women ages 21-65, health care providers may recommend a pelvic exam and a Pap test every three years. For women ages 49-65, they may recommend the Pap test and pelvic exam, combined with testing for human papilloma virus (HPV), every five years. Some types of HPV increase your risk of cervical cancer. Testing for HPV may also be done on women of any age who have unclear Pap test results.  Other health care providers may not recommend any screening for  nonpregnant women who are considered low risk for pelvic cancer and have no symptoms. Ask your health care provider if a screening pelvic exam is right for you.  If you have had past treatment for cervical cancer or a condition that could lead to cancer, you need Pap tests and screening for cancer for at least 20 years after your treatment. If Pap tests have been discontinued for you, your risk factors (such as having a new sexual partner) need to be reassessed to determine if you should start having screenings again. Some women have medical problems that increase the chance of getting cervical cancer. In these cases, your health care provider may recommend that you have screening and Pap tests more often.  If you have a family history of uterine cancer or ovarian cancer, talk with your health care provider about genetic screening.  If you have vaginal bleeding after reaching menopause, tell your health care provider.  There are currently no reliable tests available to screen for ovarian cancer.  Lung Cancer Lung cancer screening is recommended for adults 79-55 years old who are at high risk for lung cancer because of a history of smoking. A yearly low-dose CT scan of the lungs is recommended if you:  Currently smoke.  Have a history of at least 30 pack-years of smoking and you currently smoke  or have quit within the past 15 years. A pack-year is smoking an average of one pack of cigarettes per day for one year.  Yearly screening should:  Continue until it has been 15 years since you quit.  Stop if you develop a health problem that would prevent you from having lung cancer treatment.  Colorectal Cancer  This type of cancer can be detected and can often be prevented.  Routine colorectal cancer screening usually begins at age 26 and continues through age 26.  If you have risk factors for colon cancer, your health care provider may recommend that you be screened at an earlier age.  If you  have a family history of colorectal cancer, talk with your health care provider about genetic screening.  Your health care provider may also recommend using home test kits to check for hidden blood in your stool.  A small camera at the end of a tube can be used to examine your colon directly (sigmoidoscopy or colonoscopy). This is done to check for the earliest forms of colorectal cancer.  Direct examination of the colon should be repeated every 5-10 years until age 46. However, if early forms of precancerous polyps or small growths are found or if you have a family history or genetic risk for colorectal cancer, you may need to be screened more often.  Skin Cancer  Check your skin from head to toe regularly.  Monitor any moles. Be sure to tell your health care provider: ? About any new moles or changes in moles, especially if there is a change in a mole's shape or color. ? If you have a mole that is larger than the size of a pencil eraser.  If any of your family members has a history of skin cancer, especially at a young age, talk with your health care provider about genetic screening.  Always use sunscreen. Apply sunscreen liberally and repeatedly throughout the day.  Whenever you are outside, protect yourself by wearing long sleeves, pants, a wide-brimmed hat, and sunglasses.  What should I know about osteoporosis? Osteoporosis is a condition in which bone destruction happens more quickly than new bone creation. After menopause, you may be at an increased risk for osteoporosis. To help prevent osteoporosis or the bone fractures that can happen because of osteoporosis, the following is recommended:  If you are 87-51 years old, get at least 1,000 mg of calcium and at least 600 mg of vitamin D per day.  If you are older than age 24 but younger than age 71, get at least 1,200 mg of calcium and at least 600 mg of vitamin D per day.  If you are older than age 2, get at least 1,200 mg of  calcium and at least 800 mg of vitamin D per day.  Smoking and excessive alcohol intake increase the risk of osteoporosis. Eat foods that are rich in calcium and vitamin D, and do weight-bearing exercises several times each week as directed by your health care provider. What should I know about how menopause affects my mental health? Depression may occur at any age, but it is more common as you become older. Common symptoms of depression include:  Low or sad mood.  Changes in sleep patterns.  Changes in appetite or eating patterns.  Feeling an overall lack of motivation or enjoyment of activities that you previously enjoyed.  Frequent crying spells.  Talk with your health care provider if you think that you are experiencing depression. What should I  know about immunizations? It is important that you get and maintain your immunizations. These include:  Tetanus, diphtheria, and pertussis (Tdap) booster vaccine.  Influenza every year before the flu season begins.  Pneumonia vaccine.  Shingles vaccine.  Your health care provider may also recommend other immunizations. This information is not intended to replace advice given to you by your health care provider. Make sure you discuss any questions you have with your health care provider. Document Released: 01/30/2006 Document Revised: 06/27/2016 Document Reviewed: 09/11/2015 Elsevier Interactive Patient Education  2018 [employer].

## 2017-09-29 NOTE — Progress Notes (Signed)
Connie Jones [birth year] [id number]   History:    56 y.o. G3P2A1 Married.  Daughter Grad school in Physiology.  Son Equities trader in [employer].  RP:  Established patient presenting for annual gyn exam   HPI:  Rt Breast Ca 2012.  D/Ced Tamoxifen 05/2017.  Breasts wnl.  Mammo scheduled 10/2017.  Menopause.  No HRT.  No PMB.  No pelvic pain.  Mictions/BMs wnl.  BMI 22.74.    Past medical history,surgical history, family history and social history were all reviewed and documented in the EPIC chart.  Gynecologic History No LMP recorded. Patient is postmenopausal. Contraception: post menopausal status Last Pap: 09/2016. Results were: Neg/HPV HR neg Last mammogram: 10/2016. Results were: Benign Dexa Osteoporosis 10/2016 T-score -3.2 at Rt hip.  On Fosamax. Colono 2012  Obstetric History OB History  Gravida Para Term Preterm AB Living  2            SAB TAB Ectopic Multiple Live Births               # Outcome Date GA Lbr Len/2nd Weight Sex Delivery Anes PTL Lv  2 Gravida           1 Gravida                ROS: A ROS was performed and pertinent positives and negatives are included in the history.  GENERAL: No fevers or chills. HEENT: No change in vision, no earache, sore throat or sinus congestion. NECK: No pain or stiffness. CARDIOVASCULAR: No chest pain or pressure. No palpitations. PULMONARY: No shortness of breath, cough or wheeze. GASTROINTESTINAL: No abdominal pain, nausea, vomiting or diarrhea, melena or bright red blood per rectum. GENITOURINARY: No urinary frequency, urgency, hesitancy or dysuria. MUSCULOSKELETAL: No joint or muscle pain, no back pain, no recent trauma. DERMATOLOGIC: No rash, no itching, no lesions. ENDOCRINE: No polyuria, polydipsia, no heat or cold intolerance. No recent change in weight. HEMATOLOGICAL: No anemia or easy bruising or bleeding. NEUROLOGIC: No headache, seizures, numbness, tingling or weakness. PSYCHIATRIC: No depression, no loss of interest in normal activity or  change in sleep pattern.     Exam:  VSS  BP 106/70 Weight 143 Lbs Height 5'6.5" BMI 22.74 Kg/m2  Well developed well nourished female. No acute distress HEENT: Eyes: no retinal hemorrhage or exudates,  Neck supple, trachea midline, no carotid bruits, no thyroidmegaly Lungs: Clear to auscultation, no rhonchi or wheezes, or rib retractions  Heart: Regular rate and rhythm, no murmurs or gallops Breast:Examined in sitting and supine position were symmetrical in appearance, no palpable masses or tenderness,  no skin retraction, no nipple inversion, no nipple discharge, no skin discoloration, no axillary or supraclavicular lymphadenopathy Abdomen: no palpable masses or tenderness, no rebound or guarding Extremities: no edema or skin discoloration or tenderness  Pelvic: Vulva normal  Bartholin, Urethra, Skene Glands: Within normal limits             Vagina: No gross lesions or discharge  Cervix: No gross lesions or discharge.  Uterus  AV, normal size, shape and consistency, non-tender and mobile  Adnexa  Without masses or tenderness  Anus and perineum  normal     Assessment/Plan:  56 y.o. female for annual exam   1. Well female exam with routine gynecological exam Normal gyn exam.  Pap neg/HPV HR neg 09/2016.   2. Menopause present No HRT.  No PMB.  3. Age-related osteoporosis without current pathological fracture On Fosamax.  Vit D supplements.  Ca++  in nutrition.  Weight bearing physical activity.  4. Ductal carcinoma in situ (DCIS) of right breast D/ced Tamoxifen 05/2017.  Mammo scheduled for 10/2017.   Princess Bruins MD, 10:20 AM 09/29/2017

## 2017-10-02 DIAGNOSIS — Z23 Encounter for immunization: Secondary | ICD-10-CM | POA: Diagnosis not present

## 2017-11-09 ENCOUNTER — Ambulatory Visit
Admission: RE | Admit: 2017-11-09 | Discharge: 2017-11-09 | Disposition: A | Discharge status: Home / Self Care | Payer: BLUE CROSS/BLUE SHIELD | Source: Ambulatory Visit | Attending: Family Medicine | Admitting: Family Medicine

## 2017-11-09 DIAGNOSIS — R922 Inconclusive mammogram: Secondary | ICD-10-CM | POA: Diagnosis not present

## 2017-11-09 DIAGNOSIS — Z853 Personal history of malignant neoplasm of breast: Secondary | ICD-10-CM

## 2017-12-25 ENCOUNTER — Ambulatory Visit: Payer: BLUE CROSS/BLUE SHIELD | Admitting: Hematology and Oncology

## 2017-12-28 ENCOUNTER — Inpatient Hospital Stay: Payer: BLUE CROSS/BLUE SHIELD | Attending: Hematology and Oncology | Admitting: Adult Health

## 2017-12-28 ENCOUNTER — Encounter: Payer: Self-pay | Admitting: Adult Health

## 2017-12-28 ENCOUNTER — Encounter: Payer: BLUE CROSS/BLUE SHIELD | Admitting: Adult Health

## 2017-12-28 VITALS — BP 110/43 | HR 71 | Temp 98.0°F | Resp 18 | Ht 66.5 in | Wt 132.6 lb

## 2017-12-28 DIAGNOSIS — Z923 Personal history of irradiation: Secondary | ICD-10-CM | POA: Diagnosis not present

## 2017-12-28 DIAGNOSIS — Z170 Estrogen receptor positive status [ER+]: Secondary | ICD-10-CM | POA: Insufficient documentation

## 2017-12-28 DIAGNOSIS — D0511 Intraductal carcinoma in situ of right breast: Secondary | ICD-10-CM

## 2017-12-28 DIAGNOSIS — Z86000 Personal history of in-situ neoplasm of breast: Secondary | ICD-10-CM | POA: Insufficient documentation

## 2017-12-28 NOTE — Progress Notes (Signed)
CLINIC:  Survivorship   REASON FOR VISIT:  Routine follow-up for history of breast cancer.   BRIEF ONCOLOGIC HISTORY:  1. Right breast DCIS diagnosed 10/24/2011   2.  status post lumpectomy 11/05/2011 DCIS in a background of complex sclerosing lesion   3. status post radiation therapy completed 02/24/2012,   4. Adjuvant tamoxifen 20 mg daily from June 2013 to June 2018.   INTERVAL HISTORY:  Connie Jones presents to the Survivorship Clinic today for routine follow-up for her history of breast cancer.  Overall, she reports feeling quite well.  She stopped taking Tamoxifen in June, 2018 and tolerated stopping it quite well. She continues to have some mild hot flashes, but they are not as frequent as when she was taking Tamoxifen daily.     REVIEW OF SYSTEMS:  Review of Systems  Constitutional: Negative for appetite change, chills, diaphoresis, fatigue, fever and unexpected weight change.  HENT:   Negative for hearing loss and lump/mass.   Eyes: Negative for eye problems and icterus.  Respiratory: Negative for chest tightness, cough and shortness of breath.   Cardiovascular: Negative for chest pain, leg swelling and palpitations.  Gastrointestinal: Negative for abdominal distention, abdominal pain, constipation, diarrhea, nausea and vomiting.  Endocrine: Positive for hot flashes.  Genitourinary: Negative for difficulty urinating.   Musculoskeletal: Negative for arthralgias.  Skin: Negative for itching and rash.  Neurological: Negative for dizziness, extremity weakness, headaches and numbness.  Hematological: Negative for adenopathy. Does not bruise/bleed easily.  Psychiatric/Behavioral: Negative for depression. The patient is not nervous/anxious.   Breast: Denies any new nodularity, masses, tenderness, nipple changes, or nipple discharge.       PAST MEDICAL/SURGICAL HISTORY:  Past Medical History:  Diagnosis Date  . Arthritis    mild in knees  . Breast cancer ([place])    Atypical ductal hyperplasia and lobular carcinoma in-situ of right breast    Past Surgical History:  Procedure Laterality Date  . BREAST BIOPSY  11/05/2011   Procedure: BREAST BIOPSY WITH NEEDLE LOCALIZATION;  Surgeon: Rolm Bookbinder, MD;  Location: [facility];  Service: General;  Laterality: Right;  needle localization times two at [facility] 12:30  . CESAREAN SECTION    . DILATION AND EVACUATION  1993  . LAPAROSCOPY     Infertility studies     ALLERGIES:  No Known Allergies   CURRENT MEDICATIONS:  Outpatient Encounter Medications as of 12/28/2017  Medication Sig  . alendronate (FOSAMAX) 70 MG tablet Take 1 tablet (70 mg total) by mouth once a week. Take with a full glass of water on an empty stomach.  . calcium citrate-vitamin D 200-200 MG-UNIT TABS Take 2 tablets by mouth daily.   . cholecalciferol (VITAMIN D) 1000 units tablet Take 1,000 Units by mouth daily.  . fish oil-omega-3 fatty acids 1000 MG capsule Take 2 g by mouth daily.  Marland Kitchen glucosamine-chondroitin 500-400 MG tablet Take 2 tablets by mouth daily.   . Multiple Vitamins-Minerals (MULTIVITAMIN WITH MINERALS) tablet Take 1 tablet by mouth daily.    . Probiotic Product (PROBIOTIC PO) Take by mouth daily.  . vitamin E 1000 UNIT capsule Take 1,000 Units by mouth daily.   No facility-administered encounter medications on file as of 12/28/2017.      ONCOLOGIC FAMILY HISTORY:  Family History  Problem Relation Age of Onset  . Colon polyps Father   . Cancer Father        kidney  . Colon cancer Paternal Grandfather   . Anesthesia problems Sister   .  Diabetes Maternal Aunt     SOCIAL HISTORY:  Connie Jones is married and lives with her husband and son in [place], [place].  She has one daughter who is in college.  Connie Jones is currently retired.  She denies any current or history of tobacco, alcohol, or illicit drug use.     PHYSICAL EXAMINATION:  Vital Signs: Vitals:   12/28/17 1500  BP: (!) 110/43    Pulse: 71  Resp: 18  Temp: 98 F (36.7 C)  SpO2: 100%   Filed Weights   12/28/17 1500  Weight: 132 lb 9.6 oz (60.1 kg)   General: Well-nourished, well-appearing female in no acute distress.  Unaccompanied today.   HEENT: Head is normocephalic.  Pupils equal and reactive to light. Conjunctivae clear without exudate.  Sclerae anicteric. Oral mucosa is pink, moist.  Oropharynx is pink without lesions or erythema.  Lymph: No cervical, supraclavicular, or infraclavicular lymphadenopathy noted on palpation.  Cardiovascular: Regular rate and rhythm.Marland Kitchen Respiratory: Clear to auscultation bilaterally. Chest expansion symmetric; breathing non-labored.  Breast Exam:  -Left breast: No appreciable masses on palpation. No skin redness, thickening, or peau d'orange appearance; no nipple retraction or nipple discharge; .  -Right breast: No appreciable masses on palpation. No skin redness, thickening, or peau d'orange appearance; no nipple retraction or nipple discharge; mild distortion in symmetry at previous lumpectomy site well healed scar without erythema or nodularity. -Axilla: No axillary adenopathy bilaterally.  GI: Abdomen soft and round; non-tender, non-distended. Bowel sounds normoactive. No hepatosplenomegaly.   GU: Deferred.  Neuro: No focal deficits. Steady gait.  Psych: Mood and affect normal and appropriate for situation.  MSK: No focal spinal tenderness to palpation, full range of motion in bilateral upper extremities Extremities: No edema. Skin: Warm and dry.  LABORATORY DATA:  None for this visit   DIAGNOSTIC IMAGING:  Most recent mammogram:     ASSESSMENT AND PLAN:  Ms.. Jones is a pleasant 57 y.o. female with history of Stage 0 right breast DCIS, ER+/PR+, diagnosed in 10/2011, treated with lumpectomy, adjuvant radiation therapy, and anti-estrogen therapy with Tamoxifen x 5 years completing therapy in 05/2017.  She presents to the Survivorship Clinic for surveillance and routine  follow-up.   1. History of breast cancer:  Connie Jones is currently clinically and radiographically without evidence of disease or recurrence of breast cancer. She will be due for mammogram in 10/2018.  As she is now five years out from her DCIS treatment, we reviewed her options for surveillance in clinic today. These include surveillance in our long term survivorship clinic, or graduation and surveillance by her GYN.  She would prefer the latter option.  She knows that should she need anything in the future, not to hesitate to reach back out to [place].    2. Cancer screening:  Due to Ms. Mericle history and her age, she should receive screening for skin cancers, colon cancer, and gynecologic cancers. She was encouraged to follow-up with her PCP for appropriate cancer screenings.   3. Health maintenance and wellness promotion: Ms. Dehart was encouraged to consume 5-7 servings of fruits and vegetables per day. She was also encouraged to engage in moderate to vigorous exercise for 30 minutes per day most days of the week. She was instructed to limit her alcohol consumption and continue to abstain from tobacco use.    Dispo:  -Return to cancer center on an as needed basis -Mammogram due in 10/2018   A total of (30) minutes of face-to-face time  was spent with this patient with greater than 50% of that time in counseling and care-coordination.   Gardenia Phlegm, NP Survivorship Program [place] [phone]   Note: PRIMARY CARE PROVIDER Darcus Austin, [place] [phone]

## 2018-03-24 DIAGNOSIS — Z136 Encounter for screening for cardiovascular disorders: Secondary | ICD-10-CM | POA: Diagnosis not present

## 2018-03-24 DIAGNOSIS — Z0000 Encounter for general adult medical examination without abnormal findings: Secondary | ICD-10-CM | POA: Diagnosis not present

## 2018-03-24 DIAGNOSIS — Z23 Encounter for immunization: Secondary | ICD-10-CM | POA: Diagnosis not present

## 2018-09-13 ENCOUNTER — Other Ambulatory Visit: Payer: Self-pay | Admitting: Family Medicine

## 2018-09-13 DIAGNOSIS — M810 Age-related osteoporosis without current pathological fracture: Secondary | ICD-10-CM

## 2018-09-16 ENCOUNTER — Other Ambulatory Visit: Payer: Self-pay | Admitting: Family Medicine

## 2018-09-16 DIAGNOSIS — Z1231 Encounter for screening mammogram for malignant neoplasm of breast: Secondary | ICD-10-CM

## 2018-09-24 DIAGNOSIS — Z23 Encounter for immunization: Secondary | ICD-10-CM | POA: Diagnosis not present

## 2018-11-04 ENCOUNTER — Encounter: Payer: Self-pay | Admitting: Obstetrics & Gynecology

## 2018-11-04 ENCOUNTER — Ambulatory Visit (INDEPENDENT_AMBULATORY_CARE_PROVIDER_SITE_OTHER): Payer: BLUE CROSS/BLUE SHIELD | Admitting: Obstetrics & Gynecology

## 2018-11-04 VITALS — BP 120/76 | Ht 66.5 in | Wt 133.0 lb

## 2018-11-04 DIAGNOSIS — D0511 Intraductal carcinoma in situ of right breast: Secondary | ICD-10-CM

## 2018-11-04 DIAGNOSIS — Z01419 Encounter for gynecological examination (general) (routine) without abnormal findings: Secondary | ICD-10-CM | POA: Diagnosis not present

## 2018-11-04 DIAGNOSIS — Z780 Asymptomatic menopausal state: Secondary | ICD-10-CM | POA: Diagnosis not present

## 2018-11-04 DIAGNOSIS — M810 Age-related osteoporosis without current pathological fracture: Secondary | ICD-10-CM

## 2018-11-04 NOTE — Patient Instructions (Signed)
1. Well female exam with routine gynecological exam Normal gynecologic exam.  Pap test October 2017 was negative with negative high-risk HPV.  Will repeat Pap test at 3 years next year.  Breast exam normal.  Screening mammogram schedule November 2019.  Health labs with family physician.  Last colonoscopy in 2012.  2. Postmenopausal Well on no hormone replacement therapy.  No postmenopausal bleeding.  Status post right breast DCIS.  3. Age-related osteoporosis without current pathological fracture Osteoporosis followed by family physician.  Patient on Fosamax.  Next bone density schedule November 2019.  Vitamin D supplements, calcium intake of 1.5 g/day and regular weightbearing physical activity to continue.  4. Ductal carcinoma in situ (DCIS) of right breast Diagnosis in 2012.  Finished tamoxifen in June 2018.  Veverly, it was a pleasure seeing you today!

## 2018-11-04 NOTE — Progress Notes (Signed)
Connie Jones 1961-03-17 [id number]   History:    57 y.o. G3P2A1L2 Married.  Son at [employer] in [place], [place].  Daughter finished Restaurant manager, fast food in [place], Educational psychologist for [employer].  RP:  Established patient presenting for annual gyn exam   HPI: Menopause, well without hormone replacement therapy.  No postmenopausal bleeding.  No pelvic pain.  No pain with intercourse.  History of right breast cancer in 2012.  Finished tamoxifen in June 2018.  Breast normal.  Urine and bowel movements normal.  Body mass index 21.15.  Gym 5 times a week with aerobics and weightlifting.  Health labs with family physician.  Past medical history,surgical history, family history and social history were all reviewed and documented in the EPIC chart.  Gynecologic History No LMP recorded. Patient is postmenopausal. Contraception: post menopausal status Last Pap: 09/2016. Results were: Negative/HPV HR neg Last mammogram: 10/2017. Results were: Negative.  Scheduled. Bone Density: 10/2016 Osteoporosis on Fosamax.  Repeat BD scheduled Colonoscopy: 2012, 10 yr schedule  Obstetric History OB History  Gravida Para Term Preterm AB Living  3 2     1 2   SAB TAB Ectopic Multiple Live Births  1            # Outcome Date GA Lbr Len/2nd Weight Sex Delivery Anes PTL Lv  3 SAB           2 Para           1 Para              ROS: A ROS was performed and pertinent positives and negatives are included in the history.  GENERAL: No fevers or chills. HEENT: No change in vision, no earache, sore throat or sinus congestion. NECK: No pain or stiffness. CARDIOVASCULAR: No chest pain or pressure. No palpitations. PULMONARY: No shortness of breath, cough or wheeze. GASTROINTESTINAL: No abdominal pain, nausea, vomiting or diarrhea, melena or bright red blood per rectum. GENITOURINARY: No urinary frequency, urgency, hesitancy or dysuria. MUSCULOSKELETAL: No joint or muscle pain, no back pain, no recent trauma. DERMATOLOGIC: No rash, no  itching, no lesions. ENDOCRINE: No polyuria, polydipsia, no heat or cold intolerance. No recent change in weight. HEMATOLOGICAL: No anemia or easy bruising or bleeding. NEUROLOGIC: No headache, seizures, numbness, tingling or weakness. PSYCHIATRIC: No depression, no loss of interest in normal activity or change in sleep pattern.     Exam:   BP 120/76   Ht 5' 6.5" (1.689 m)   Wt 133 lb (60.3 kg)   BMI 21.15 kg/m   Body mass index is 21.15 kg/m.  General appearance : Well developed well nourished female. No acute distress HEENT: Eyes: no retinal hemorrhage or exudates,  Neck supple, trachea midline, no carotid bruits, no thyroidmegaly Lungs: Clear to auscultation, no rhonchi or wheezes, or rib retractions  Heart: Regular rate and rhythm, no murmurs or gallops Breast:Examined in sitting and supine position were symmetrical in appearance, no palpable masses or tenderness,  no skin retraction, no nipple inversion, no nipple discharge, no skin discoloration, no axillary or supraclavicular lymphadenopathy Abdomen: no palpable masses or tenderness, no rebound or guarding Extremities: no edema or skin discoloration or tenderness  Pelvic: Vulva: Normal             Vagina: No gross lesions or discharge  Cervix: No gross lesions or discharge  Uterus  AV, normal size, shape and consistency, non-tender and mobile  Adnexa  Without masses or tenderness  Anus: Normal   Assessment/Plan:  57 y.o. female for annual exam   1. Well female exam with routine gynecological exam Normal gynecologic exam.  Pap test October 2017 was negative with negative high-risk HPV.  Will repeat Pap test at 3 years next year.  Breast exam normal.  Screening mammogram schedule November 2019.  Health labs with family physician.  Last colonoscopy in 2012.  2. Postmenopausal Well on no hormone replacement therapy.  No postmenopausal bleeding.  Status post right breast DCIS.  3. Age-related osteoporosis without current  pathological fracture Osteoporosis followed by family physician.  Patient on Fosamax.  Next bone density schedule November 2019.  Vitamin D supplements, calcium intake of 1.5 g/day and regular weightbearing physical activity to continue.  4. Ductal carcinoma in situ (DCIS) of right breast Diagnosis in 2012.  Finished tamoxifen in June 2018.  Princess Bruins MD, 10:15 AM 11/04/2018

## 2018-11-16 ENCOUNTER — Ambulatory Visit
Admission: RE | Admit: 2018-11-16 | Discharge: 2018-11-16 | Disposition: A | Discharge status: Home / Self Care | Payer: BLUE CROSS/BLUE SHIELD | Source: Ambulatory Visit | Attending: Family Medicine | Admitting: Family Medicine

## 2018-11-16 DIAGNOSIS — Z1231 Encounter for screening mammogram for malignant neoplasm of breast: Secondary | ICD-10-CM

## 2018-11-16 DIAGNOSIS — M810 Age-related osteoporosis without current pathological fracture: Secondary | ICD-10-CM

## 2018-11-16 DIAGNOSIS — Z780 Asymptomatic menopausal state: Secondary | ICD-10-CM | POA: Diagnosis not present

## 2018-11-16 DIAGNOSIS — M8589 Other specified disorders of bone density and structure, multiple sites: Secondary | ICD-10-CM | POA: Diagnosis not present

## 2018-11-16 HISTORY — DX: Personal history of irradiation: Z92.3

## 2019-01-13 DIAGNOSIS — L308 Other specified dermatitis: Secondary | ICD-10-CM | POA: Diagnosis not present

## 2019-01-13 DIAGNOSIS — L814 Other melanin hyperpigmentation: Secondary | ICD-10-CM | POA: Diagnosis not present

## 2019-01-13 DIAGNOSIS — D225 Melanocytic nevi of trunk: Secondary | ICD-10-CM | POA: Diagnosis not present

## 2019-01-13 DIAGNOSIS — D1801 Hemangioma of skin and subcutaneous tissue: Secondary | ICD-10-CM | POA: Diagnosis not present

## 2019-06-20 DIAGNOSIS — M810 Age-related osteoporosis without current pathological fracture: Secondary | ICD-10-CM | POA: Diagnosis not present

## 2019-06-20 DIAGNOSIS — Z0000 Encounter for general adult medical examination without abnormal findings: Secondary | ICD-10-CM | POA: Diagnosis not present

## 2019-06-20 DIAGNOSIS — Z853 Personal history of malignant neoplasm of breast: Secondary | ICD-10-CM | POA: Diagnosis not present

## 2019-07-06 DIAGNOSIS — Z8249 Family history of ischemic heart disease and other diseases of the circulatory system: Secondary | ICD-10-CM | POA: Diagnosis not present

## 2019-07-06 DIAGNOSIS — Z0000 Encounter for general adult medical examination without abnormal findings: Secondary | ICD-10-CM | POA: Diagnosis not present

## 2019-07-06 DIAGNOSIS — Z853 Personal history of malignant neoplasm of breast: Secondary | ICD-10-CM | POA: Diagnosis not present

## 2019-10-04 ENCOUNTER — Other Ambulatory Visit: Payer: Self-pay | Admitting: Family Medicine

## 2019-10-04 DIAGNOSIS — Z1231 Encounter for screening mammogram for malignant neoplasm of breast: Secondary | ICD-10-CM

## 2019-11-10 ENCOUNTER — Encounter: Payer: BLUE CROSS/BLUE SHIELD | Admitting: Obstetrics & Gynecology

## 2019-11-21 ENCOUNTER — Ambulatory Visit
Admission: RE | Admit: 2019-11-21 | Discharge: 2019-11-21 | Disposition: A | Discharge status: Home / Self Care | Payer: BC Managed Care – PPO | Source: Ambulatory Visit | Attending: Family Medicine | Admitting: Family Medicine

## 2019-11-21 ENCOUNTER — Other Ambulatory Visit: Payer: Self-pay

## 2019-11-21 DIAGNOSIS — Z1231 Encounter for screening mammogram for malignant neoplasm of breast: Secondary | ICD-10-CM | POA: Diagnosis not present

## 2019-11-24 ENCOUNTER — Other Ambulatory Visit: Payer: Self-pay

## 2019-11-25 ENCOUNTER — Encounter: Payer: Self-pay | Admitting: Obstetrics & Gynecology

## 2019-11-25 ENCOUNTER — Ambulatory Visit (INDEPENDENT_AMBULATORY_CARE_PROVIDER_SITE_OTHER): Payer: BC Managed Care – PPO | Admitting: Obstetrics & Gynecology

## 2019-11-25 VITALS — BP 104/70 | Ht 66.25 in | Wt 134.4 lb

## 2019-11-25 DIAGNOSIS — M8589 Other specified disorders of bone density and structure, multiple sites: Secondary | ICD-10-CM

## 2019-11-25 DIAGNOSIS — Z780 Asymptomatic menopausal state: Secondary | ICD-10-CM

## 2019-11-25 DIAGNOSIS — Z853 Personal history of malignant neoplasm of breast: Secondary | ICD-10-CM

## 2019-11-25 DIAGNOSIS — Z01419 Encounter for gynecological examination (general) (routine) without abnormal findings: Secondary | ICD-10-CM

## 2019-11-25 DIAGNOSIS — D0511 Intraductal carcinoma in situ of right breast: Secondary | ICD-10-CM

## 2019-11-25 NOTE — Progress Notes (Signed)
Connie Jones [birth year] [id number]   History:    58 y.o. G3P2A1L2 Married.  Son at [employer] in [place], [place].  Daughter finished Restaurant manager, fast food in [place], Educational psychologist for [employer].  RP:  Established patient presenting for annual gyn exam   HPI: Menopause, well without hormone replacement therapy.  No postmenopausal bleeding.  No pelvic pain.  No pain with intercourse.  History of right breast cancer in 2012.  Finished tamoxifen in June 2018.  Breast normal.  Urine and bowel movements normal.  Body mass index 21.53.  Home Gym 5 times a week with aerobics and weightlifting.  Health labs with family physician.   Past medical history,surgical history, family history and social history were all reviewed and documented in the EPIC chart.  Gynecologic History No LMP recorded. Patient is postmenopausal. Contraception: post menopausal status Last Pap: 2018. Results were: Negative Last mammogram: 10/2019. Results were: Negative Bone Density: 10/2018 Osteopenia T-Score -2.0 Colonoscopy: 2012, 10 yr schedule  Obstetric History OB History  Gravida Para Term Preterm AB Living  3 2     1 2   SAB TAB Ectopic Multiple Live Births  1            # Outcome Date GA Lbr Len/2nd Weight Sex Delivery Anes PTL Lv  3 SAB           2 Para           1 Para              ROS: A ROS was performed and pertinent positives and negatives are included in the history.  GENERAL: No fevers or chills. HEENT: No change in vision, no earache, sore throat or sinus congestion. NECK: No pain or stiffness. CARDIOVASCULAR: No chest pain or pressure. No palpitations. PULMONARY: No shortness of breath, cough or wheeze. GASTROINTESTINAL: No abdominal pain, nausea, vomiting or diarrhea, melena or bright red blood per rectum. GENITOURINARY: No urinary frequency, urgency, hesitancy or dysuria. MUSCULOSKELETAL: No joint or muscle pain, no back pain, no recent trauma. DERMATOLOGIC: No rash, no itching, no lesions. ENDOCRINE: No  polyuria, polydipsia, no heat or cold intolerance. No recent change in weight. HEMATOLOGICAL: No anemia or easy bruising or bleeding. NEUROLOGIC: No headache, seizures, numbness, tingling or weakness. PSYCHIATRIC: No depression, no loss of interest in normal activity or change in sleep pattern.     Exam:   BP 104/70   Ht 5' 6.25" (1.683 m)   Wt 134 lb 6.4 oz (61 kg)   BMI 21.53 kg/m   Body mass index is 21.53 kg/m.  General appearance : Well developed well nourished female. No acute distress HEENT: Eyes: no retinal hemorrhage or exudates,  Neck supple, trachea midline, no carotid bruits, no thyroidmegaly Lungs: Clear to auscultation, no rhonchi or wheezes, or rib retractions  Heart: Regular rate and rhythm, no murmurs or gallops Breast:Examined in sitting and supine position were symmetrical in appearance, no palpable masses or tenderness,  no skin retraction, no nipple inversion, no nipple discharge, no skin discoloration, no axillary or supraclavicular lymphadenopathy Abdomen: no palpable masses or tenderness, no rebound or guarding Extremities: no edema or skin discoloration or tenderness  Pelvic: Vulva: Normal             Vagina: No gross lesions or discharge  Cervix: No gross lesions or discharge  Uterus  AV, normal size, shape and consistency, non-tender and mobile  Adnexa  Without masses or tenderness  Anus: Normal   Assessment/Plan:  58 y.o. female for  annual exam   1. Well female exam with routine gynecological exam Normal gynecologic exam in menopause.  Pap test in 2018 was negative, will repeat next year.  Breast exam normal.  Screening mammogram November 2020 was negative.  Colonoscopy in 2012, on a 10-year schedule.  Health labs with family physician.  Good body mass index at 21.53.  Continue with fitness and healthy nutrition.  2. Postmenopausal Well on no hormone replacement therapy.  No postmenopausal bleeding.  3. Osteopenia of multiple sites Osteopenia on  last bone density November 2019 with a T score of -2.0.  Continue with calcium 1200 mg daily, vitamin D supplements and regular weightbearing physical activities.  We will repeat a bone density in November 2021.  4. Ductal carcinoma in situ (DCIS) of right breast   Princess Bruins MD, 10:59 AM 11/25/2019

## 2019-11-25 NOTE — Patient Instructions (Signed)
1. Well female exam with routine gynecological exam Normal gynecologic exam in menopause.  Pap test in 2018 was negative, will repeat next year.  Breast exam normal.  Screening mammogram November 2020 was negative.  Colonoscopy in 2012, on a 10-year schedule.  Health labs with family physician.  Good body mass index at 21.53.  Continue with fitness and healthy nutrition.  2. Postmenopausal Well on no hormone replacement therapy.  No postmenopausal bleeding.  3. Osteopenia of multiple sites Osteopenia on last bone density November 2019 with a T score of -2.0.  Continue with calcium 1200 mg daily, vitamin D supplements and regular weightbearing physical activities.  We will repeat a bone density in November 2021.  4. Ductal carcinoma in situ (DCIS) of right breast  Connie Jones, it was a pleasure seeing you today!

## 2020-03-22 ENCOUNTER — Ambulatory Visit: Payer: BC Managed Care – PPO | Attending: Internal Medicine

## 2020-03-22 DIAGNOSIS — Z23 Encounter for immunization: Secondary | ICD-10-CM

## 2020-03-22 NOTE — Progress Notes (Signed)
   Covid-19 Vaccination Clinic  Name:  Connie Jones    MRN: [id number] DOB: 01-16-1961  03/22/2020  Connie Jones was observed post Covid-19 immunization for 15 minutes without incident. She was provided with Vaccine Information Sheet and instruction to access the V-Safe system.   Connie Jones was instructed to call 911 with any severe reactions post vaccine: Marland Kitchen Difficulty breathing  . Swelling of face and throat  . A fast heartbeat  . A bad rash all over body  . Dizziness and weakness   Immunizations Administered    Name Date Dose VIS Date Route   Pfizer COVID-19 Vaccine 03/22/2020  3:36 PM 0.3 mL 12/02/2019 Intramuscular   Manufacturer: [place]   Lot: [id number]   [place]: [id number]

## 2020-04-14 ENCOUNTER — Ambulatory Visit: Payer: BC Managed Care – PPO | Attending: Internal Medicine

## 2020-04-14 DIAGNOSIS — Z23 Encounter for immunization: Secondary | ICD-10-CM

## 2020-04-14 NOTE — Progress Notes (Signed)
   Covid-19 Vaccination Clinic  Name:  MARKIETA BRISBY    MRN: [id number] DOB: March 19, 1961  04/14/2020  Ms. Wandrey was observed post Covid-19 immunization for 15 minutes without incident. She was provided with Vaccine Information Sheet and instruction to access the V-Safe system.   Ms. Clinebell was instructed to call 911 with any severe reactions post vaccine: Marland Kitchen Difficulty breathing  . Swelling of face and throat  . A fast heartbeat  . A bad rash all over body  . Dizziness and weakness   Immunizations Administered    Name Date Dose VIS Date Route   Pfizer COVID-19 Vaccine 04/14/2020  2:37 PM 0.3 mL 02/15/2019 Intramuscular   Manufacturer: [place]   Lot: [id number]   [place]: [id number]

## 2020-04-16 ENCOUNTER — Ambulatory Visit: Payer: BC Managed Care – PPO

## 2020-06-21 DIAGNOSIS — M810 Age-related osteoporosis without current pathological fracture: Secondary | ICD-10-CM | POA: Diagnosis not present

## 2020-06-21 DIAGNOSIS — Z13220 Encounter for screening for lipoid disorders: Secondary | ICD-10-CM | POA: Diagnosis not present

## 2020-06-21 DIAGNOSIS — Z0000 Encounter for general adult medical examination without abnormal findings: Secondary | ICD-10-CM | POA: Diagnosis not present

## 2020-06-21 DIAGNOSIS — R002 Palpitations: Secondary | ICD-10-CM | POA: Diagnosis not present

## 2020-07-17 DIAGNOSIS — Z20822 Contact with and (suspected) exposure to covid-19: Secondary | ICD-10-CM | POA: Diagnosis not present

## 2020-07-19 DIAGNOSIS — F430 Acute stress reaction: Secondary | ICD-10-CM | POA: Diagnosis not present

## 2020-07-19 DIAGNOSIS — U071 COVID-19: Secondary | ICD-10-CM | POA: Diagnosis not present

## 2020-10-01 ENCOUNTER — Other Ambulatory Visit: Payer: Self-pay | Admitting: Family Medicine

## 2020-10-01 DIAGNOSIS — Z1231 Encounter for screening mammogram for malignant neoplasm of breast: Secondary | ICD-10-CM

## 2020-10-16 DIAGNOSIS — F4321 Adjustment disorder with depressed mood: Secondary | ICD-10-CM | POA: Diagnosis not present

## 2020-10-29 ENCOUNTER — Other Ambulatory Visit: Payer: Self-pay | Admitting: Family Medicine

## 2020-10-29 DIAGNOSIS — M8580 Other specified disorders of bone density and structure, unspecified site: Secondary | ICD-10-CM

## 2020-10-31 ENCOUNTER — Other Ambulatory Visit: Payer: Self-pay | Admitting: Family Medicine

## 2020-10-31 DIAGNOSIS — M8580 Other specified disorders of bone density and structure, unspecified site: Secondary | ICD-10-CM

## 2020-11-21 ENCOUNTER — Other Ambulatory Visit: Payer: Self-pay

## 2020-11-21 ENCOUNTER — Ambulatory Visit
Admission: RE | Admit: 2020-11-21 | Discharge: 2020-11-21 | Disposition: A | Discharge status: Home / Self Care | Payer: BC Managed Care – PPO | Source: Ambulatory Visit | Attending: Family Medicine | Admitting: Family Medicine

## 2020-11-21 DIAGNOSIS — Z1231 Encounter for screening mammogram for malignant neoplasm of breast: Secondary | ICD-10-CM

## 2020-12-12 ENCOUNTER — Ambulatory Visit (INDEPENDENT_AMBULATORY_CARE_PROVIDER_SITE_OTHER): Payer: BC Managed Care – PPO | Admitting: Obstetrics & Gynecology

## 2020-12-12 ENCOUNTER — Other Ambulatory Visit: Payer: Self-pay

## 2020-12-12 ENCOUNTER — Encounter: Payer: Self-pay | Admitting: Obstetrics & Gynecology

## 2020-12-12 VITALS — BP 128/80 | Ht 66.0 in | Wt 128.0 lb

## 2020-12-12 DIAGNOSIS — Z853 Personal history of malignant neoplasm of breast: Secondary | ICD-10-CM | POA: Diagnosis not present

## 2020-12-12 DIAGNOSIS — Z01419 Encounter for gynecological examination (general) (routine) without abnormal findings: Secondary | ICD-10-CM

## 2020-12-12 DIAGNOSIS — M8589 Other specified disorders of bone density and structure, multiple sites: Secondary | ICD-10-CM

## 2020-12-12 DIAGNOSIS — Z780 Asymptomatic menopausal state: Secondary | ICD-10-CM | POA: Diagnosis not present

## 2020-12-12 DIAGNOSIS — D0511 Intraductal carcinoma in situ of right breast: Secondary | ICD-10-CM

## 2020-12-12 NOTE — Addendum Note (Signed)
Addended by: Lorine Bears on: 12/12/2020 02:59 PM   Modules accepted: Orders

## 2020-12-12 NOTE — Progress Notes (Signed)
Connie Jones [birth year] [id number]   History:    59 y.o. G3P2A1L2 Married. Son at [employer] in [place], [place]. Daughter finished Restaurant manager, fast food in [place], Educational psychologist for [employer] school.  [id number] presenting for annual gyn exam   [id number], well without hormone replacement therapy. No postmenopausal bleeding. No pelvic pain. No pain with intercourse. History of right breast cancer in 2012. Finished tamoxifen in June 2018. Breast normal. Urine and bowel movements normal. Body mass index 20.66. Home Gym 5 times a week with aerobics and weightlifting. Health labs with family physician.  Past medical history,surgical history, family history and social history were all reviewed and documented in the EPIC chart.  Gynecologic History No LMP recorded. Patient is postmenopausal.  Obstetric History OB History  Gravida Para Term Preterm AB Living  3 2     1 2   SAB IAB Ectopic Multiple Live Births  1            # Outcome Date GA Lbr Len/2nd Weight Sex Delivery Anes PTL Lv  3 SAB           2 Para           1 Para              ROS: A ROS was performed and pertinent positives and negatives are included in the history.  GENERAL: No fevers or chills. HEENT: No change in vision, no earache, sore throat or sinus congestion. NECK: No pain or stiffness. CARDIOVASCULAR: No chest pain or pressure. No palpitations. PULMONARY: No shortness of breath, cough or wheeze. GASTROINTESTINAL: No abdominal pain, nausea, vomiting or diarrhea, melena or bright red blood per rectum. GENITOURINARY: No urinary frequency, urgency, hesitancy or dysuria. MUSCULOSKELETAL: No joint or muscle pain, no back pain, no recent trauma. DERMATOLOGIC: No rash, no itching, no lesions. ENDOCRINE: No polyuria, polydipsia, no heat or cold intolerance. No recent change in weight. HEMATOLOGICAL: No anemia or easy bruising or bleeding. NEUROLOGIC: No headache, seizures, numbness, tingling or weakness. PSYCHIATRIC:  No depression, no loss of interest in normal activity or change in sleep pattern.     Exam:   Ht 5\' 6"  (1.676 m)    Wt 128 lb (58.1 kg)    BMI 20.66 kg/m   Body mass index is 20.66 kg/m.  General appearance : Well developed well nourished female. No acute distress HEENT: Eyes: no retinal hemorrhage or exudates,  Neck supple, trachea midline, no carotid bruits, no thyroidmegaly Lungs: Clear to auscultation, no rhonchi or wheezes, or rib retractions  Heart: Regular rate and rhythm, no murmurs or gallops Breast:Examined in sitting and supine position were symmetrical in appearance, no palpable masses or tenderness,  no skin retraction, no nipple inversion, no nipple discharge, no skin discoloration, no axillary or supraclavicular lymphadenopathy Abdomen: no palpable masses or tenderness, no rebound or guarding Extremities: no edema or skin discoloration or tenderness  Pelvic: Vulva: Normal             Vagina: No gross lesions or discharge  Cervix: No gross lesions or discharge.  Pap reflex done.  Uterus  AV, normal size, shape and consistency, non-tender and mobile  Adnexa  Without masses or tenderness  Anus: Normal   Assessment/Plan:  59 y.o. female for annual exam   1. Encounter for routine gynecological examination with Papanicolaou smear of cervix Normal gynecologic exam in postmenopause.  Pap reflex done.  Breast exam normal.  Screening mammo 11/2020 Neg.  Colono will be due in 2022.  Health labs with  Fam MD.  BMI 20.66.  Fit and healthy nutrition.  2. Postmenopausal Well on no HRT.  No PMB.  3. Osteopenia of multiple sites Bone Density [facility].  Continue with Vit D supplements, Ca++ 1500 mg daily and regular weight bearing physical activities.  4. Ductal carcinoma in situ (DCIS) of right breast Finished Tamoxifen.  Screening Mammo Neg 11/2020.  Genia Del MD, 2:06 PM 12/12/2020

## 2020-12-17 LAB — PAP IG W/ RFLX HPV ASCU

## 2021-02-12 ENCOUNTER — Other Ambulatory Visit: Payer: BC Managed Care – PPO

## 2021-02-13 ENCOUNTER — Other Ambulatory Visit: Payer: Self-pay

## 2021-02-13 ENCOUNTER — Ambulatory Visit
Admission: RE | Admit: 2021-02-13 | Discharge: 2021-02-13 | Disposition: A | Discharge status: Home / Self Care | Payer: BC Managed Care – PPO | Source: Ambulatory Visit | Attending: Family Medicine | Admitting: Family Medicine

## 2021-02-13 DIAGNOSIS — M8589 Other specified disorders of bone density and structure, multiple sites: Secondary | ICD-10-CM | POA: Diagnosis not present

## 2021-02-13 DIAGNOSIS — Z780 Asymptomatic menopausal state: Secondary | ICD-10-CM | POA: Diagnosis not present

## 2021-02-13 DIAGNOSIS — M8580 Other specified disorders of bone density and structure, unspecified site: Secondary | ICD-10-CM

## 2021-05-30 DIAGNOSIS — L814 Other melanin hyperpigmentation: Secondary | ICD-10-CM | POA: Diagnosis not present

## 2021-05-30 DIAGNOSIS — L810 Postinflammatory hyperpigmentation: Secondary | ICD-10-CM | POA: Diagnosis not present

## 2021-05-30 DIAGNOSIS — D225 Melanocytic nevi of trunk: Secondary | ICD-10-CM | POA: Diagnosis not present

## 2021-05-30 DIAGNOSIS — L821 Other seborrheic keratosis: Secondary | ICD-10-CM | POA: Diagnosis not present

## 2021-05-30 DIAGNOSIS — L820 Inflamed seborrheic keratosis: Secondary | ICD-10-CM | POA: Diagnosis not present

## 2021-06-19 ENCOUNTER — Encounter: Payer: Self-pay | Admitting: Gastroenterology

## 2021-06-21 DIAGNOSIS — E559 Vitamin D deficiency, unspecified: Secondary | ICD-10-CM | POA: Diagnosis not present

## 2021-06-21 DIAGNOSIS — Z0000 Encounter for general adult medical examination without abnormal findings: Secondary | ICD-10-CM | POA: Diagnosis not present

## 2021-06-21 DIAGNOSIS — M810 Age-related osteoporosis without current pathological fracture: Secondary | ICD-10-CM | POA: Diagnosis not present

## 2021-06-21 DIAGNOSIS — Z13220 Encounter for screening for lipoid disorders: Secondary | ICD-10-CM | POA: Diagnosis not present

## 2021-06-28 ENCOUNTER — Other Ambulatory Visit (HOSPITAL_COMMUNITY): Payer: Self-pay | Admitting: Family Medicine

## 2021-07-03 ENCOUNTER — Other Ambulatory Visit: Payer: Self-pay

## 2021-07-03 ENCOUNTER — Ambulatory Visit (HOSPITAL_COMMUNITY)
Admission: RE | Admit: 2021-07-03 | Discharge: 2021-07-03 | Disposition: A | Discharge status: Home / Self Care | Payer: Self-pay | Source: Ambulatory Visit | Attending: Family Medicine | Admitting: Family Medicine

## 2021-07-03 DIAGNOSIS — E7800 Pure hypercholesterolemia, unspecified: Secondary | ICD-10-CM | POA: Insufficient documentation

## 2021-07-11 ENCOUNTER — Encounter: Payer: Self-pay | Admitting: Gastroenterology

## 2021-09-10 ENCOUNTER — Encounter: Payer: Self-pay | Admitting: Gastroenterology

## 2021-09-10 ENCOUNTER — Ambulatory Visit (AMBULATORY_SURGERY_CENTER): Payer: BC Managed Care – PPO | Admitting: *Deleted

## 2021-09-10 ENCOUNTER — Other Ambulatory Visit: Payer: Self-pay

## 2021-09-10 VITALS — Ht 67.0 in | Wt 130.0 lb

## 2021-09-10 DIAGNOSIS — Z1211 Encounter for screening for malignant neoplasm of colon: Secondary | ICD-10-CM

## 2021-09-10 MED ORDER — PLENVU 140 G PO SOLR: 1.0000 | ORAL | 0 refills | Status: DC

## 2021-09-10 NOTE — Progress Notes (Signed)
No egg or soy allergy known to patient  issues known to pt with past sedation with any surgeries or procedures of PONV post op Breast Lumpectomy  Patient denies ever being told they had issues or difficulty with intubation  No FH of Malignant Hyperthermia Pt is not on diet pills Pt is not on  home 02  Pt is not on blood thinners  Pt denies issues with constipation  No A fib or A flutter  EMMI video to pt or via [place] 19 guidelines implemented in PV today with Pt and RN   Pt is fully vaccinated  for Covid   Plenvu  Coupon given to pt in PV today , Code to Pharmacy and  NO PA's for preps discussed with pt In PV today  Discussed with pt there will be an out-of-pocket cost for prep and that varies from $0 to 70 +  dollars   Due to the COVID-19 pandemic we are asking patients to follow certain guidelines.  Pt aware of COVID protocols and [facility] guidelines   Pt verified name, DOB, address and insurance during PV today.  Pt mailed instruction packet of Emmi video, copy of consent form to read and not return, and instructions.  Plenvu  coupon mailed in packet.  PV completed over the phone.  Pt encouraged to call with questions or issues.  My Chart instructions to pt as well

## 2021-09-18 ENCOUNTER — Telehealth: Payer: Self-pay | Admitting: Gastroenterology

## 2021-09-18 NOTE — Telephone Encounter (Signed)
Spoke with the patient. She has been able to see prep instructions via Mychart. Answered prep questions.  Plenvu coupon called into pt's pharmacy-pt is aware.

## 2021-09-18 NOTE — Telephone Encounter (Signed)
Inbound call from patient. States she have not received coupon for plenvu

## 2021-09-24 ENCOUNTER — Other Ambulatory Visit: Payer: Self-pay

## 2021-09-24 ENCOUNTER — Encounter: Payer: Self-pay | Admitting: Gastroenterology

## 2021-09-24 ENCOUNTER — Ambulatory Visit (AMBULATORY_SURGERY_CENTER): Payer: BC Managed Care – PPO | Admitting: Gastroenterology

## 2021-09-24 VITALS — BP 113/49 | HR 85 | Temp 98.9°F | Resp 11 | Ht 66.0 in | Wt 130.0 lb

## 2021-09-24 DIAGNOSIS — D120 Benign neoplasm of cecum: Secondary | ICD-10-CM | POA: Diagnosis not present

## 2021-09-24 DIAGNOSIS — Z1211 Encounter for screening for malignant neoplasm of colon: Secondary | ICD-10-CM

## 2021-09-24 MED ORDER — SODIUM CHLORIDE 0.9 % IV SOLN: 500.0000 mL | Freq: Once | INTRAVENOUS | Status: DC

## 2021-09-24 NOTE — Progress Notes (Signed)
HPI: This is a woman at routine risk for colon cacner   ROS: complete GI ROS as described in HPI, all other review negative.  Constitutional:  No unintentional weight loss   Past Medical History:  Diagnosis Date   Arthritis    mild in knees   Breast cancer ([place])    Atypical ductal hyperplasia and lobular carcinoma in-situ of right breast    Hyperlipidemia    borderline- no meds   Osteoporosis    Personal history of radiation therapy 2013   PONV (postoperative nausea and vomiting)    with breast surgery 2012    Past Surgical History:  Procedure Laterality Date   BREAST BIOPSY  11/05/2011   Procedure: BREAST BIOPSY WITH NEEDLE LOCALIZATION;  Surgeon: Rolm Bookbinder, MD;  Location: [facility];  Service: General;  Laterality: Right;  needle localization times two at [facility] 12:30   BREAST LUMPECTOMY Right 2012   CESAREAN SECTION     x1   COLONOSCOPY  06/16/2011   [facility]     Infertility studies    Current Outpatient Medications  Medication Sig Dispense Refill   alendronate (FOSAMAX) 70 MG tablet Take 1 tablet (70 mg total) by mouth once a week. Take with a full glass of water on an empty stomach.     calcium citrate-vitamin D 200-200 MG-UNIT TABS Take 2 tablets by mouth daily.     cholecalciferol (VITAMIN D) 1000 units tablet Take 1,000 Units by mouth daily.     fish oil-omega-3 fatty acids 1000 MG capsule Take 2 g by mouth daily.     glucosamine-chondroitin 500-400 MG tablet Take 2 tablets by mouth daily.     hydroquinone 4 % cream Apply topically 2 (two) times daily as needed.     Multiple Vitamins-Minerals (MULTIVITAMIN WITH MINERALS) tablet Take 1 tablet by mouth daily.     Probiotic Product (PROBIOTIC PO) Take by mouth daily.     vitamin E 180 MG (400 UNITS) capsule      Current Facility-Administered Medications  Medication Dose Route Frequency Provider Last Rate Last Admin   0.9 %  sodium chloride infusion  500 mL  Intravenous Once Milus Banister, MD        Allergies as of 09/24/2021   (No Known Allergies)    Family History  Problem Relation Age of Onset   Colon polyps Father    Cancer Father        kidney   Anesthesia problems Sister    Diabetes Maternal Aunt    Colon cancer Paternal Grandfather    Esophageal cancer Neg Hx    Stomach cancer Neg Hx    Rectal cancer Neg Hx     Social History   Socioeconomic History   Marital status: Married    Spouse name: Not on file   Number of children: Not on file   Years of education: Not on file   Highest education level: Not on file  Occupational History   Not on file  Tobacco Use   Smoking status: Never   Smokeless tobacco: Never  Vaping Use   Vaping Use: Never used  Substance and Sexual Activity   Alcohol use: Not Currently   Drug use: No   Sexual activity: Yes    Partners: Male    Comment: 1st intercourse- 22, partners- 2,  married- 38 yrs  Other Topics Concern   Not on file  Social History Narrative   Not on file  Social Determinants of Health   Financial Resource Strain: Not on file  Food Insecurity: Not on file  Transportation Needs: Not on file  Physical Activity: Not on file  Stress: Not on file  Social Connections: Not on file  Intimate Partner Violence: Not on file     Physical Exam: BP 124/69   Pulse 86   Temp 98.9 F (37.2 C)   Ht 5\' 6"  (1.676 m)   Wt 130 lb (59 kg)   SpO2 98%   BMI 20.98 kg/m  Constitutional: generally well-appearing Psychiatric: alert and oriented x3 Lungs: CTA bilaterally Heart: no MCR  Assessment and plan: 60 y.o. female with routine risk for colon cancer  Colonoscopy now.  Care is appropriate for the ambulatory setting.  Owens Loffler, MD [facility] 09/24/2021, 2:00 PM

## 2021-09-24 NOTE — Progress Notes (Signed)
Pt Drowsy. VSS. To PACU, report to RN. No anesthetic complications noted.  

## 2021-09-24 NOTE — Patient Instructions (Signed)
Thank you for letting us take care of your healthcare needs today. Please see handouts given to you on Hemorrhoids and Polyps.   YOU HAD AN ENDOSCOPIC PROCEDURE TODAY AT [facility]:   Refer to the procedure report that was given to you for any specific questions about what was found during the examination.  If the procedure report does not answer your questions, please call your gastroenterologist to clarify.  If you requested that your care partner not be given the details of your procedure findings, then the procedure report has been included in a sealed envelope for you to review at your convenience later.  YOU SHOULD EXPECT: Some feelings of bloating in the abdomen. Passage of more gas than usual.  Walking can help get rid of the air that was put into your GI tract during the procedure and reduce the bloating. If you had a lower endoscopy (such as a colonoscopy or flexible sigmoidoscopy) you may notice spotting of blood in your stool or on the toilet paper. If you underwent a bowel prep for your procedure, you may not have a normal bowel movement for a few days.  Please Note:  You might notice some irritation and congestion in your nose or some drainage.  This is from the oxygen used during your procedure.  There is no need for concern and it should clear up in a day or so.  SYMPTOMS TO REPORT IMMEDIATELY:  Following lower endoscopy (colonoscopy or flexible sigmoidoscopy):  Excessive amounts of blood in the stool  Significant tenderness or worsening of abdominal pains  Swelling of the abdomen that is new, acute  Fever of 100F or higher   For urgent or emergent issues, a gastroenterologist can be reached at any hour by calling [phone]. Do not use MyChart messaging for urgent concerns.    DIET:  We do recommend a small meal at first, but then you may proceed to your regular diet.  Drink plenty of fluids but you should avoid alcoholic beverages for 24  hours.  ACTIVITY:  You should plan to take it easy for the rest of today and you should NOT DRIVE or use heavy machinery until tomorrow (because of the sedation medicines used during the test).    FOLLOW UP: Our staff will call the number listed on your records 48-72 hours following your procedure to check on you and address any questions or concerns that you may have regarding the information given to you following your procedure. If we do not reach you, we will leave a message.  We will attempt to reach you two times.  During this call, we will ask if you have developed any symptoms of COVID 19. If you develop any symptoms (ie: fever, flu-like symptoms, shortness of breath, cough etc.) before then, please call [phone].  If you test positive for Covid 19 in the 2 weeks post procedure, please call and report this information to [place].    If any biopsies were taken you will be contacted by phone or by letter within the next 1-3 weeks.  Please call us at [phone] if you have not heard about the biopsies in 3 weeks.    SIGNATURES/CONFIDENTIALITY: You and/or your care partner have signed paperwork which will be entered into your electronic medical record.  These signatures attest to the fact that that the information above on your After Visit Summary has been reviewed and is understood.  Full responsibility of the confidentiality of this discharge information lies  with you and/or your care-partner.  

## 2021-09-24 NOTE — Progress Notes (Signed)
VS- Connie Jones  Pt's states no medical or surgical changes since previsit or office visit.  

## 2021-09-24 NOTE — Op Note (Signed)
[facility] Patient Name: Connie Jones Procedure Date: 09/24/2021 2:01 PM MRN: [id number] Endoscopist: Milus Banister , MD Age: 60 Referring MD:  Date of Birth: 1961/08/17 Gender: Female Account #: [id number] Procedure:                Colonoscopy Indications:              Screening for colorectal malignant neoplasm Medicines:                Monitored Anesthesia Care Procedure:                Pre-Anesthesia Assessment:                           - Prior to the procedure, a History and Physical                            was performed, and patient medications and                            allergies were reviewed. The patient's tolerance of                            previous anesthesia was also reviewed. The risks                            and benefits of the procedure and the sedation                            options and risks were discussed with the patient.                            All questions were answered, and informed consent                            was obtained. Prior Anticoagulants: The patient has                            taken no previous anticoagulant or antiplatelet                            agents. ASA Grade Assessment: II - A patient with                            mild systemic disease. After reviewing the risks                            and benefits, the patient was deemed in                            satisfactory condition to undergo the procedure.                           After obtaining informed consent, the colonoscope  was passed under direct vision. Throughout the                            procedure, the patient's blood pressure, pulse, and                            oxygen saturations were monitored continuously. The                            Olympus PCF-H190DL (#[id number]) Colonoscope was                            introduced through the anus and advanced to the the                            cecum, identified by  appendiceal orifice and                            ileocecal valve. The colonoscopy was performed                            without difficulty. The patient tolerated the                            procedure well. The quality of the bowel                            preparation was adequate. The ileocecal valve,                            appendiceal orifice, and rectum were photographed. Scope In: 2:07:59 PM Scope Out: 2:25:31 PM Scope Withdrawal Time: 0 hours 7 minutes 33 seconds  Total Procedure Duration: 0 hours 17 minutes 32 seconds  Findings:                 A 4 mm polyp was found in the cecum. The polyp was                            sessile. The polyp was removed with a cold snare.                            Resection and retrieval were complete.                           Small internal hemorrhoids.                           The exam was otherwise without abnormality on                            direct and retroflexion views. Complications:            No immediate complications. Estimated blood loss:  None. Estimated Blood Loss:     Estimated blood loss: none. Impression:               - One 4 mm polyp in the cecum, removed with a cold                            snare. Resected and retrieved.                           - Hemorrhoids.                           - The examination was otherwise normal on direct                            and retroflexion views. Recommendation:           - Patient has a contact number available for                            emergencies. The signs and symptoms of potential                            delayed complications were discussed with the                            patient. Return to normal activities tomorrow.                            Written discharge instructions were provided to the                            patient.                           - Resume previous diet.                           - Continue present  medications.                           - Await pathology results. Milus Banister, MD 09/24/2021 2:27:54 PM This report has been signed electronically.

## 2021-09-24 NOTE — Progress Notes (Signed)
Called to room to assist during endoscopic procedure.  Patient ID and intended procedure confirmed with present staff. Received instructions for my participation in the procedure from the performing physician.  

## 2021-09-26 ENCOUNTER — Telehealth: Payer: Self-pay | Admitting: *Deleted

## 2021-09-26 ENCOUNTER — Telehealth: Payer: Self-pay

## 2021-09-26 NOTE — Telephone Encounter (Signed)
  Follow up Call-  Call back number 09/24/2021 09/24/2021  Post procedure Call Back phone  # [phone]  Permission to leave phone message Yes Yes  Some recent data might be hidden     Patient questions:  Do you have a fever, pain , or abdominal swelling? No. Pain Score  0 *  Have you tolerated food without any problems? Yes.    Have you been able to return to your normal activities? Yes.    Do you have any questions about your discharge instructions: Diet   No. Medications  No. Follow up visit  No.  Do you have questions or concerns about your Care? No.  Actions: * If pain score is 4 or above: No action needed, pain <4.

## 2021-09-26 NOTE — Telephone Encounter (Signed)
First attempt, left VM.  

## 2021-09-29 ENCOUNTER — Encounter: Payer: Self-pay | Admitting: Gastroenterology

## 2021-10-03 ENCOUNTER — Other Ambulatory Visit: Payer: Self-pay | Admitting: Family Medicine

## 2021-10-03 DIAGNOSIS — Z1231 Encounter for screening mammogram for malignant neoplasm of breast: Secondary | ICD-10-CM

## 2021-11-22 ENCOUNTER — Other Ambulatory Visit: Payer: Self-pay

## 2021-11-22 ENCOUNTER — Ambulatory Visit
Admission: RE | Admit: 2021-11-22 | Discharge: 2021-11-22 | Disposition: A | Discharge status: Home / Self Care | Payer: BC Managed Care – PPO | Source: Ambulatory Visit | Attending: Family Medicine | Admitting: Family Medicine

## 2021-11-22 DIAGNOSIS — Z1231 Encounter for screening mammogram for malignant neoplasm of breast: Secondary | ICD-10-CM | POA: Diagnosis not present

## 2022-01-02 ENCOUNTER — Other Ambulatory Visit: Payer: Self-pay

## 2022-01-02 ENCOUNTER — Ambulatory Visit (INDEPENDENT_AMBULATORY_CARE_PROVIDER_SITE_OTHER): Payer: BC Managed Care – PPO | Admitting: Obstetrics & Gynecology

## 2022-01-02 ENCOUNTER — Encounter: Payer: Self-pay | Admitting: Obstetrics & Gynecology

## 2022-01-02 VITALS — BP 122/64 | HR 87 | Ht 65.5 in | Wt 132.0 lb

## 2022-01-02 DIAGNOSIS — D0511 Intraductal carcinoma in situ of right breast: Secondary | ICD-10-CM | POA: Diagnosis not present

## 2022-01-02 DIAGNOSIS — Z01419 Encounter for gynecological examination (general) (routine) without abnormal findings: Secondary | ICD-10-CM

## 2022-01-02 DIAGNOSIS — Z780 Asymptomatic menopausal state: Secondary | ICD-10-CM

## 2022-01-02 DIAGNOSIS — M8589 Other specified disorders of bone density and structure, multiple sites: Secondary | ICD-10-CM

## 2022-01-02 NOTE — Progress Notes (Signed)
Connie Jones 17-Aug-1961 [id number]   History:    61 y.o. G3P2A1L2 Married.  Son at [employer] in [place], [place].  Daughter finished Restaurant manager, fast food in [place], Educational psychologist for [employer].   RP:  Established patient presenting for annual gyn exam    HPI: Postmenopause, well without hormone replacement therapy.  No postmenopausal bleeding.  No pelvic pain.  No pain with intercourse.  Pap 11/2020 Neg.  History of right breast cancer in 2012.  Finished tamoxifen in June 2018.  Breast normal.  Mammo Neg 11/2021.  Urine and bowel movements normal.  Body mass index 21.63.  Home Gym 5 times a week with aerobics and weightlifting.  BD improved to Osteopenia with T-Score of -1.9 in 01/2021.  On Fosamax x 5 yrs, will stop now to take a Rx Holiday.  Health labs with family physician.  Colono 09/2021.  Past medical history,surgical history, family history and social history were all reviewed and documented in the EPIC chart.  Gynecologic History No LMP recorded. Patient is postmenopausal.  Obstetric History OB History  Gravida Para Term Preterm AB Living  3 2     1 2   SAB IAB Ectopic Multiple Live Births  1            # Outcome Date GA Lbr Len/2nd Weight Sex Delivery Anes PTL Lv  3 SAB           2 Para           1 Para              ROS: A ROS was performed and pertinent positives and negatives are included in the history.  GENERAL: No fevers or chills. HEENT: No change in vision, no earache, sore throat or sinus congestion. NECK: No pain or stiffness. CARDIOVASCULAR: No chest pain or pressure. No palpitations. PULMONARY: No shortness of breath, cough or wheeze. GASTROINTESTINAL: No abdominal pain, nausea, vomiting or diarrhea, melena or bright red blood per rectum. GENITOURINARY: No urinary frequency, urgency, hesitancy or dysuria. MUSCULOSKELETAL: No joint or muscle pain, no back pain, no recent trauma. DERMATOLOGIC: No rash, no itching, no lesions. ENDOCRINE: No polyuria, polydipsia, no heat or cold  intolerance. No recent change in weight. HEMATOLOGICAL: No anemia or easy bruising or bleeding. NEUROLOGIC: No headache, seizures, numbness, tingling or weakness. PSYCHIATRIC: No depression, no loss of interest in normal activity or change in sleep pattern.     Exam:   BP 122/64    Pulse 87    Ht 5' 5.5" (1.664 m)    Wt 132 lb (59.9 kg)    SpO2 100%    BMI 21.63 kg/m   Body mass index is 21.63 kg/m.  General appearance : Well developed well nourished female. No acute distress HEENT: Eyes: no retinal hemorrhage or exudates,  Neck supple, trachea midline, no carotid bruits, no thyroidmegaly Lungs: Clear to auscultation, no rhonchi or wheezes, or rib retractions  Heart: Regular rate and rhythm, no murmurs or gallops Breast:Examined in sitting and supine position were symmetrical in appearance, no palpable masses or tenderness,  no skin retraction, no nipple inversion, no nipple discharge, no skin discoloration, no axillary or supraclavicular lymphadenopathy Abdomen: no palpable masses or tenderness, no rebound or guarding Extremities: no edema or skin discoloration or tenderness  Pelvic: Vulva: Normal             Vagina: No gross lesions or discharge  Cervix: No gross lesions or discharge  Uterus  AV, normal size, shape and consistency,  non-tender and mobile  Adnexa  Without masses or tenderness  Anus: Normal   Assessment/Plan:  61 y.o. female for annual exam   1. Well female exam with routine gynecological exam Postmenopause, well without hormone replacement therapy.  No postmenopausal bleeding.  No pelvic pain.  No pain with intercourse.  Pap 11/2020 Neg.  History of right breast cancer in 2012. Finished tamoxifen in June 2018.  Breast normal.  Mammo Neg 11/2021.  Urine and bowel movements normal.  Body mass index 21.63.  Home Gym 5 times a week with aerobics and weightlifting.  BD improved to Osteopenia with T-Score of -1.9 in 01/2021.  On Fosamax x 5 yrs, will stop now to take a Rx  Holiday.  Health labs with family physician.  Colono 09/2021.  2. Postmenopausal Postmenopause, well without hormone replacement therapy.  No postmenopausal bleeding.  No pelvic pain.  No pain with intercourse.    3. Osteopenia of multiple sites BD improved to Osteopenia with T-Score of -1.9 in 01/2021.  On Fosamax x 5 yrs, will stop now to take a Rx Holiday.  Continue Vit D supplement, Ca++ total of 1.5 g/d and regular wt bearing physical activities.  4. Ductal carcinoma in situ (DCIS) of right breast  History of right breast cancer in 2012. Finished tamoxifen in June 2018.  Breast normal.  Mammo Neg 11/2021.  Princess Bruins MD, 1:51 PM 01/02/2022

## 2022-07-01 DIAGNOSIS — Z79899 Other long term (current) drug therapy: Secondary | ICD-10-CM | POA: Diagnosis not present

## 2022-07-01 DIAGNOSIS — E559 Vitamin D deficiency, unspecified: Secondary | ICD-10-CM | POA: Diagnosis not present

## 2022-07-01 DIAGNOSIS — Z0000 Encounter for general adult medical examination without abnormal findings: Secondary | ICD-10-CM | POA: Diagnosis not present

## 2022-07-01 DIAGNOSIS — E781 Pure hyperglyceridemia: Secondary | ICD-10-CM | POA: Diagnosis not present

## 2022-10-07 ENCOUNTER — Other Ambulatory Visit: Payer: Self-pay | Admitting: Family Medicine

## 2022-10-07 DIAGNOSIS — Z1231 Encounter for screening mammogram for malignant neoplasm of breast: Secondary | ICD-10-CM

## 2022-11-17 DIAGNOSIS — D225 Melanocytic nevi of trunk: Secondary | ICD-10-CM | POA: Diagnosis not present

## 2022-11-17 DIAGNOSIS — L298 Other pruritus: Secondary | ICD-10-CM | POA: Diagnosis not present

## 2022-11-17 DIAGNOSIS — L820 Inflamed seborrheic keratosis: Secondary | ICD-10-CM | POA: Diagnosis not present

## 2022-11-17 DIAGNOSIS — L538 Other specified erythematous conditions: Secondary | ICD-10-CM | POA: Diagnosis not present

## 2022-11-17 DIAGNOSIS — L821 Other seborrheic keratosis: Secondary | ICD-10-CM | POA: Diagnosis not present

## 2022-11-17 DIAGNOSIS — L218 Other seborrheic dermatitis: Secondary | ICD-10-CM | POA: Diagnosis not present

## 2022-11-17 DIAGNOSIS — L814 Other melanin hyperpigmentation: Secondary | ICD-10-CM | POA: Diagnosis not present

## 2022-11-24 ENCOUNTER — Ambulatory Visit
Admission: RE | Admit: 2022-11-24 | Discharge: 2022-11-24 | Disposition: A | Discharge status: Home / Self Care | Payer: BC Managed Care – PPO | Source: Ambulatory Visit | Attending: Family Medicine | Admitting: Family Medicine

## 2022-11-24 DIAGNOSIS — Z1231 Encounter for screening mammogram for malignant neoplasm of breast: Secondary | ICD-10-CM | POA: Diagnosis not present

## 2022-12-31 DIAGNOSIS — H43811 Vitreous degeneration, right eye: Secondary | ICD-10-CM | POA: Diagnosis not present

## 2023-03-04 ENCOUNTER — Ambulatory Visit: Payer: BC Managed Care – PPO | Admitting: Obstetrics & Gynecology

## 2023-03-11 ENCOUNTER — Ambulatory Visit (INDEPENDENT_AMBULATORY_CARE_PROVIDER_SITE_OTHER): Payer: BC Managed Care – PPO | Admitting: Obstetrics & Gynecology

## 2023-03-11 ENCOUNTER — Encounter: Payer: Self-pay | Admitting: Obstetrics & Gynecology

## 2023-03-11 ENCOUNTER — Other Ambulatory Visit (HOSPITAL_COMMUNITY)
Admission: RE | Admit: 2023-03-11 | Discharge: 2023-03-11 | Disposition: A | Discharge status: Home / Self Care | Payer: BC Managed Care – PPO | Source: Ambulatory Visit | Attending: Obstetrics & Gynecology | Admitting: Obstetrics & Gynecology

## 2023-03-11 VITALS — BP 110/78 | HR 86 | Ht 65.75 in | Wt 134.0 lb

## 2023-03-11 DIAGNOSIS — Z01419 Encounter for gynecological examination (general) (routine) without abnormal findings: Secondary | ICD-10-CM | POA: Diagnosis not present

## 2023-03-11 DIAGNOSIS — Z780 Asymptomatic menopausal state: Secondary | ICD-10-CM | POA: Diagnosis not present

## 2023-03-11 DIAGNOSIS — D0511 Intraductal carcinoma in situ of right breast: Secondary | ICD-10-CM | POA: Diagnosis not present

## 2023-03-11 DIAGNOSIS — M8589 Other specified disorders of bone density and structure, multiple sites: Secondary | ICD-10-CM

## 2023-03-11 NOTE — Progress Notes (Signed)
Connie Jones 04/28/1961 [id number]   History:    62 y.o. G3P2A1L2 Married.  Son at [employer], 63 yo starting Restaurant manager, fast food in [place], [place].  Daughter finished Restaurant manager, fast food in [place], in [place] school.   RP:  Established patient presenting for annual gyn exam    HPI: Postmenopause, well without hormone replacement therapy.  No postmenopausal bleeding.  No pelvic pain.  No pain with intercourse.  Pap 11/2020 Neg.  Pap reflex today.  History of right breast cancer in 2012.  Finished tamoxifen in June 2018.  Breast normal.  Mammo Neg 11/2022.  Urine and bowel movements normal.  Body mass index 21.79.  Home Gym 5 times a week with aerobics and weightlifting.  BD improved to Osteopenia with T-Score of -1.9 in 01/2021.  On Fosamax x 5 yrs, on drug Holiday x last year.  Schedule BD here now.  Health labs with family physician.  Colono 09/2021.   Past medical history,surgical history, family history and social history were all reviewed and documented in the EPIC chart.  Gynecologic History No LMP recorded. Patient is postmenopausal.  Obstetric History OB History  Gravida Para Term Preterm AB Living  3 2 1 1 1 2   SAB IAB Ectopic Multiple Live Births  1       2    # Outcome Date GA Lbr Len/2nd Weight Sex Delivery Anes PTL Lv  3 SAB           2 Term           1 Preterm              ROS: A ROS was performed and pertinent positives and negatives are included in the history. GENERAL: No fevers or chills. HEENT: No change in vision, no earache, sore throat or sinus congestion. NECK: No pain or stiffness. CARDIOVASCULAR: No chest pain or pressure. No palpitations. PULMONARY: No shortness of breath, cough or wheeze. GASTROINTESTINAL: No abdominal pain, nausea, vomiting or diarrhea, melena or bright red blood per rectum. GENITOURINARY: No urinary frequency, urgency, hesitancy or dysuria. MUSCULOSKELETAL: No joint or muscle pain, no back pain, no recent trauma. DERMATOLOGIC: No rash, no itching, no lesions.  ENDOCRINE: No polyuria, polydipsia, no heat or cold intolerance. No recent change in weight. HEMATOLOGICAL: No anemia or easy bruising or bleeding. NEUROLOGIC: No headache, seizures, numbness, tingling or weakness. PSYCHIATRIC: No depression, no loss of interest in normal activity or change in sleep pattern.     Exam:   BP 110/78   Pulse 86   Ht 5' 5.75" (1.67 m)   Wt 134 lb (60.8 kg)   SpO2 98%   BMI 21.79 kg/m   Body mass index is 21.79 kg/m.  General appearance : Well developed well nourished female. No acute distress HEENT: Eyes: no retinal hemorrhage or exudates,  Neck supple, trachea midline, no carotid bruits, no thyroidmegaly Lungs: Clear to auscultation, no rhonchi or wheezes, or rib retractions  Heart: Regular rate and rhythm, no murmurs or gallops Breast:Examined in sitting and supine position were symmetrical in appearance, no palpable masses or tenderness,  no skin retraction, no nipple inversion, no nipple discharge, no skin discoloration, no axillary or supraclavicular lymphadenopathy Abdomen: no palpable masses or tenderness, no rebound or guarding Extremities: no edema or skin discoloration or tenderness  Pelvic: Vulva: Normal             Vagina: No gross lesions or discharge  Cervix: No gross lesions or discharge.  Pap reflex done.  Uterus  AV,  normal size, shape and consistency, non-tender and mobile  Adnexa  Without masses or tenderness  Anus: Normal   Assessment/Plan:  62 y.o. female for annual exam   1. Encounter for routine gynecological examination with Papanicolaou smear of cervix Postmenopause, well without hormone replacement therapy.  No postmenopausal bleeding.  No pelvic pain.  No pain with intercourse.  Pap 11/2020 Neg.  Pap reflex today.  History of right breast cancer in 2012.  Finished tamoxifen in June 2018.  Breast normal.  Mammo Neg 11/2022.  Urine and bowel movements normal.  Body mass index 21.79.  Home Gym 5 times a week with aerobics and  weightlifting.  BD improved to Osteopenia with T-Score of -1.9 in 01/2021.  On Fosamax x 5 yrs, on drug Holiday x last year.  Schedule BD here now.  Health labs with family physician.  Colono 09/2021. - Cytology - PAP( )  2. Postmenopausal Postmenopause, well without hormone replacement therapy.  No postmenopausal bleeding.  No pelvic pain.  No pain with intercourse.    3. Osteopenia of multiple sites Body mass index 21.79.  Home Gym 5 times a week with aerobics and weightlifting.  BD improved to Osteopenia with T-Score of -1.9 in 01/2021.  On Fosamax x 5 yrs, on drug Holiday x last year.  Schedule BD here now.  - DG Bone Density; Future  4. Ductal carcinoma in situ (DCIS) of right breast   Princess Bruins MD, 2:37 PM

## 2023-03-16 LAB — CYTOLOGY - PAP: Diagnosis: NEGATIVE

## 2023-03-31 ENCOUNTER — Ambulatory Visit (INDEPENDENT_AMBULATORY_CARE_PROVIDER_SITE_OTHER): Payer: BC Managed Care – PPO

## 2023-03-31 ENCOUNTER — Other Ambulatory Visit: Payer: Self-pay | Admitting: Obstetrics & Gynecology

## 2023-03-31 DIAGNOSIS — M8589 Other specified disorders of bone density and structure, multiple sites: Secondary | ICD-10-CM

## 2023-03-31 DIAGNOSIS — Z780 Asymptomatic menopausal state: Secondary | ICD-10-CM

## 2023-03-31 DIAGNOSIS — Z13820 Encounter for screening for osteoporosis: Secondary | ICD-10-CM

## 2023-03-31 DIAGNOSIS — D0511 Intraductal carcinoma in situ of right breast: Secondary | ICD-10-CM

## 2023-03-31 DIAGNOSIS — Z01419 Encounter for gynecological examination (general) (routine) without abnormal findings: Secondary | ICD-10-CM

## 2023-07-17 DIAGNOSIS — Z0000 Encounter for general adult medical examination without abnormal findings: Secondary | ICD-10-CM | POA: Diagnosis not present

## 2023-07-17 DIAGNOSIS — Z79899 Other long term (current) drug therapy: Secondary | ICD-10-CM | POA: Diagnosis not present

## 2023-07-17 DIAGNOSIS — E781 Pure hyperglyceridemia: Secondary | ICD-10-CM | POA: Diagnosis not present

## 2023-07-17 DIAGNOSIS — E559 Vitamin D deficiency, unspecified: Secondary | ICD-10-CM | POA: Diagnosis not present

## 2023-08-28 ENCOUNTER — Other Ambulatory Visit: Payer: Self-pay | Admitting: Family Medicine

## 2023-08-28 DIAGNOSIS — Z1231 Encounter for screening mammogram for malignant neoplasm of breast: Secondary | ICD-10-CM

## 2023-11-26 ENCOUNTER — Ambulatory Visit
Admission: RE | Admit: 2023-11-26 | Discharge: 2023-11-26 | Disposition: A | Discharge status: Home / Self Care | Payer: BC Managed Care – PPO | Source: Ambulatory Visit | Attending: Family Medicine | Admitting: Family Medicine

## 2023-11-26 DIAGNOSIS — Z1231 Encounter for screening mammogram for malignant neoplasm of breast: Secondary | ICD-10-CM | POA: Diagnosis not present

## 2024-02-01 DIAGNOSIS — M25552 Pain in left hip: Secondary | ICD-10-CM | POA: Diagnosis not present

## 2024-04-07 ENCOUNTER — Encounter: Payer: Self-pay | Admitting: Obstetrics and Gynecology

## 2024-04-07 NOTE — Progress Notes (Signed)
 63 y.o. [id number] Married Caucasian female here for annual exam.  Patient states she is still having hot flashes.  They are mild.  Managing ok.    Occasional urinary incontinence.  Not doing Kegel's regularly.  Denies vaginal bleeding.   Has a son and a daughter.   PCP: Ransom Byers, MD   No LMP recorded. Patient is postmenopausal.           Sexually active: Yes.    The current method of family planning is post menopausal status.    Menopausal hormone therapy: none Exercising: Yes.     Weight lifting and walking Smoker: no  OB History  Gravida Para Term Preterm AB Living  3 2 1 1 1 2   SAB IAB Ectopic Multiple Live Births  1    2    # Outcome Date GA Lbr Len/2nd Weight Sex Type Anes PTL Lv  3 SAB           2 Term           1 Preterm              HEALTH MAINTENANCE: Last 2 paps: 2021-WNL, 03/11/2023-WNL History of abnormal Pap or positive HPV:  no Mammogram: 11/26/2023-WNL, Cat C Colonoscopy: 09/24/2021, Due 09/2028. Bone Density: 03/31/2023  Result osteopenia of hips and spine, hx of fosamax  use   Immunization History  Administered Date(s) Administered   Influenza,inj,Quad PF,6+ Mos 09/17/2019   PFIZER(Purple Top)SARS-COV-2 Vaccination 03/22/2020, 04/14/2020   Tdap 03/24/2018      reports that she has never smoked. She has never used smokeless tobacco. She reports that she does not currently use alcohol. She reports that she does not use drugs.  Past Medical History:  Diagnosis Date   Arthritis    mild in knees   Breast cancer (HCC)    Atypical ductal hyperplasia and lobular carcinoma in-situ of right breast    Hyperlipidemia    borderline- no meds   Osteoporosis    Personal history of radiation therapy 2013   PONV (postoperative nausea and vomiting)    with breast surgery 2012    Past Surgical History:  Procedure Laterality Date   BREAST BIOPSY  11/05/2011   Procedure: BREAST BIOPSY WITH NEEDLE LOCALIZATION;  Surgeon: Enid Harry, MD;   Location: [facility];  Service: General;  Laterality: Right;  needle localization times two at [facility] 12:30   BREAST LUMPECTOMY Right 2012   CESAREAN SECTION     x1   COLONOSCOPY  06/16/2011   DILATION AND EVACUATION  1993   LAPAROSCOPY     Infertility studies    Current Outpatient Medications  Medication Sig Dispense Refill   calcium citrate-vitamin D  200-200 MG-UNIT TABS Take 2 tablets by mouth daily.     cholecalciferol (VITAMIN D ) 1000 units tablet Take 1,000 Units by mouth daily.     fish oil-omega-3 fatty acids 1000 MG capsule Take 2 g by mouth daily.     glucosamine-chondroitin 500-400 MG tablet Take 2 tablets by mouth daily.     Multiple Vitamins-Minerals (MULTIVITAMIN WITH MINERALS) tablet Take 1 tablet by mouth daily.     Probiotic Product (PROBIOTIC PO) Take by mouth daily.     vitamin E 180 MG (400 UNITS) capsule      No current facility-administered medications for this visit.    ALLERGIES: Patient has no known allergies.  Family History  Problem Relation Age of Onset   Colon polyps Father    Cancer Father  kidney   Anesthesia problems Sister    Diabetes Maternal Aunt    Colon cancer Paternal Grandfather     Review of Systems  All other systems reviewed and are negative.   PHYSICAL EXAM:  BP 124/76 (BP Location: Left Arm, Patient Position: Sitting, Cuff Size: Normal)   Pulse 87   Ht 5' 6.5" (1.689 m)   Wt 133 lb (60.3 kg)   SpO2 97%   BMI 21.15 kg/m     General appearance: alert, cooperative and appears stated age Head: normocephalic, without obvious abnormality, atraumatic Neck: no adenopathy, supple, symmetrical, trachea midline and thyroid  normal to inspection and palpation Lungs: clear to auscultation bilaterally Breasts: scar of right breast. Left breast normal appearance.  Both breasts with no masses or tenderness, No nipple retraction or dimpling, No nipple discharge or bleeding, No axillary adenopathy Heart: regular rate and  rhythm Abdomen: soft, non-tender; no masses, no organomegaly Extremities: extremities normal, atraumatic, no cyanosis or edema Skin: skin color, texture, turgor normal. No rashes or lesions Lymph nodes: cervical, supraclavicular, and axillary nodes normal. Neurologic: grossly normal  Pelvic: External genitalia:  no lesions              No abnormal inguinal nodes palpated.              Urethra:  normal appearing urethra with no masses, tenderness or lesions              Bartholins and Skenes: normal                 Vagina: normal appearing vagina with normal color and discharge, no lesions              Cervix: no lesions              Pap taken: no Bimanual Exam:  Uterus:  normal size, contour, position, consistency, mobility, non-tender.  Good Kegel.               Adnexa: no mass, fullness, tenderness              Rectal exam: yes.  Confirms.              Anus:  normal sphincter tone, no lesions  Chaperone was present for exam:   Cottie Diss, CMA  ASSESSMENT: Well woman visit with gynecologic exam. Hx right breast cancer dx in 2012.  Status post lumpectomy, XRT, and Tamoxifen .  Hx osteoporosis.  Current osteopenia.  Status post Fosamax  for 5 years.  On current drug holiday.  Menopausal symptoms.  Mild.  Mild urinary incontinence.  PHQ-9: 0  PLAN: Mammogram screening discussed. Self breast awareness reviewed. Pap and HRV collected:  no Guidelines for Calcium, Vitamin D , regular exercise program including cardiovascular and weight bearing exercise. Medication refills:  NA Labs with PCP.  Kegel's reviewed.  I briefly discussed pelvic floor therapy, medication and sometimes surgery for treatment of urinary incontinence.  Patient will monitor her symptoms and reach out if she would like additional assistance.  BMD in one year at [facility]. Follow up:  yearly and prn.

## 2024-04-12 ENCOUNTER — Encounter: Payer: Self-pay | Admitting: Obstetrics and Gynecology

## 2024-04-12 ENCOUNTER — Ambulatory Visit (INDEPENDENT_AMBULATORY_CARE_PROVIDER_SITE_OTHER): Payer: BC Managed Care – PPO | Admitting: Obstetrics and Gynecology

## 2024-04-12 VITALS — BP 124/76 | HR 87 | Ht 66.5 in | Wt 133.0 lb

## 2024-04-12 DIAGNOSIS — Z01419 Encounter for gynecological examination (general) (routine) without abnormal findings: Secondary | ICD-10-CM | POA: Diagnosis not present

## 2024-04-12 DIAGNOSIS — Z8739 Personal history of other diseases of the musculoskeletal system and connective tissue: Secondary | ICD-10-CM | POA: Diagnosis not present

## 2024-04-12 DIAGNOSIS — Z1331 Encounter for screening for depression: Secondary | ICD-10-CM

## 2024-04-12 DIAGNOSIS — N951 Menopausal and female climacteric states: Secondary | ICD-10-CM

## 2024-04-12 DIAGNOSIS — R32 Unspecified urinary incontinence: Secondary | ICD-10-CM

## 2024-04-12 NOTE — Patient Instructions (Addendum)
 EXERCISE AND DIET:  We recommended that you start or continue a regular exercise program for good health. Regular exercise means any activity that makes your heart beat faster and makes you sweat.  We recommend exercising at least 30 minutes per day at least 3 days a week, preferably 4 or 5.  We also recommend a diet low in fat and sugar.  Inactivity, poor dietary choices and obesity can cause diabetes, heart attack, stroke, and kidney damage, among others.    ALCOHOL AND SMOKING:  Women should limit their alcohol intake to no more than 7 drinks/beers/glasses of wine (combined, not each!) per week. Moderation of alcohol intake to this level decreases your risk of breast cancer and liver damage. And of course, no recreational drugs are part of a healthy lifestyle.  And absolutely no smoking or even second hand smoke. Most people know smoking can cause heart and lung diseases, but did you know it also contributes to weakening of your bones? Aging of your skin?  Yellowing of your teeth and nails?  CALCIUM AND VITAMIN D :  Adequate intake of calcium and Vitamin D  are recommended.  The recommendations for exact amounts of these supplements seem to change often, but generally speaking 600 mg of calcium (either carbonate or citrate) and 800 units of Vitamin D  per day seems prudent. Certain women may benefit from higher intake of Vitamin D .  If you are among these women, your doctor will have told you during your visit.    PAP SMEARS:  Pap smears, to check for cervical cancer or precancers,  have traditionally been done yearly, although recent scientific advances have shown that most women can have pap smears less often.  However, every woman still should have a physical exam from her gynecologist every year. It will include a breast check, inspection of the vulva and vagina to check for abnormal growths or skin changes, a visual exam of the cervix, and then an exam to evaluate the size and shape of the uterus and  ovaries.  And after 64 years of age, a rectal exam is indicated to check for rectal cancers. We will also provide age appropriate advice regarding health maintenance, like when you should have certain vaccines, screening for sexually transmitted diseases, bone density testing, colonoscopy, mammograms, etc.   MAMMOGRAMS:  All women over 55 years old should have a yearly mammogram. Many facilities now offer a "3D" mammogram, which may cost around $50 extra out of pocket. If possible,  we recommend you accept the option to have the 3D mammogram performed.  It both reduces the number of women who will be called back for extra views which then turn out to be normal, and it is better than the routine mammogram at detecting truly abnormal areas.    COLONOSCOPY:  Colonoscopy to screen for colon cancer is recommended for all women at age 73.  We know, you hate the idea of the prep.  We agree, BUT, having colon cancer and not knowing it is worse!!  Colon cancer so often starts as a polyp that can be seen and removed at colonscopy, which can quite literally save your life!  And if your first colonoscopy is normal and you have no family history of colon cancer, most women don't have to have it again for 10 years.  Once every ten years, you can do something that may end up saving your life, right?  We will be happy to help you get it scheduled when you are ready.  Be sure to check your insurance coverage so you understand how much it will cost.  It may be covered as a preventative service at no cost, but you should check your particular policy.    Kegel Exercises  Kegel exercises can help strengthen your pelvic floor muscles. The pelvic floor is a group of muscles that support your rectum, small intestine, and bladder. In females, pelvic floor muscles also help support the uterus. These muscles help you control the flow of urine and stool (feces). Kegel exercises are painless and simple. They do not require any  equipment. Your provider may suggest Kegel exercises to: Improve bladder and bowel control. Improve sexual response. Improve weak pelvic floor muscles after surgery to remove the uterus (hysterectomy) or after pregnancy, in females. Improve weak pelvic floor muscles after prostate gland removal or surgery, in males. Kegel exercises involve squeezing your pelvic floor muscles. These are the same muscles you squeeze when you try to stop the flow of urine or keep from passing gas. The exercises can be done while sitting, standing, or lying down, but it is best to vary your position. Ask your health care provider which exercises are safe for you. Do exercises exactly as told by your health care provider and adjust them as directed. Do not begin these exercises until told by your health care provider. Exercises How to do Kegel exercises: Squeeze your pelvic floor muscles tight. You should feel a tight lift in your rectal area. If you are a female, you should also feel a tightness in your vaginal area. Keep your stomach, buttocks, and legs relaxed. Hold the muscles tight for up to 10 seconds. Breathe normally. Relax your muscles for up to 10 seconds. Repeat as told by your health care provider. Repeat this exercise daily as told by your health care provider. Continue to do this exercise for at least 4-6 weeks, or for as long as told by your health care provider. You may be referred to a physical therapist who can help you learn more about how to do Kegel exercises. Depending on your condition, your health care provider may recommend: Varying how long you squeeze your muscles. Doing several sets of exercises every day. Doing exercises for several weeks. Making Kegel exercises a part of your regular exercise routine. This information is not intended to replace advice given to you by your health care provider. Make sure you discuss any questions you have with your health care provider. Document Revised:  04/18/2021 Document Reviewed: 04/18/2021 Elsevier Patient Education  2024 [employer].

## 2024-06-20 DIAGNOSIS — M25552 Pain in left hip: Secondary | ICD-10-CM | POA: Diagnosis not present

## 2024-07-01 DIAGNOSIS — M79605 Pain in left leg: Secondary | ICD-10-CM | POA: Diagnosis not present

## 2024-07-12 DIAGNOSIS — M25552 Pain in left hip: Secondary | ICD-10-CM | POA: Diagnosis not present

## 2024-07-12 DIAGNOSIS — M79605 Pain in left leg: Secondary | ICD-10-CM | POA: Diagnosis not present

## 2024-07-12 DIAGNOSIS — M5459 Other low back pain: Secondary | ICD-10-CM | POA: Diagnosis not present

## 2024-07-19 DIAGNOSIS — M79605 Pain in left leg: Secondary | ICD-10-CM | POA: Diagnosis not present

## 2024-07-21 DIAGNOSIS — Z79899 Other long term (current) drug therapy: Secondary | ICD-10-CM | POA: Diagnosis not present

## 2024-07-21 DIAGNOSIS — E781 Pure hyperglyceridemia: Secondary | ICD-10-CM | POA: Diagnosis not present

## 2024-07-21 DIAGNOSIS — Z23 Encounter for immunization: Secondary | ICD-10-CM | POA: Diagnosis not present

## 2024-07-21 DIAGNOSIS — Z0000 Encounter for general adult medical examination without abnormal findings: Secondary | ICD-10-CM | POA: Diagnosis not present

## 2024-07-21 DIAGNOSIS — E559 Vitamin D deficiency, unspecified: Secondary | ICD-10-CM | POA: Diagnosis not present

## 2024-07-25 DIAGNOSIS — M79605 Pain in left leg: Secondary | ICD-10-CM | POA: Diagnosis not present

## 2024-08-02 DIAGNOSIS — M79605 Pain in left leg: Secondary | ICD-10-CM | POA: Diagnosis not present

## 2024-08-03 DIAGNOSIS — M545 Low back pain, unspecified: Secondary | ICD-10-CM | POA: Diagnosis not present

## 2024-08-10 DIAGNOSIS — M79605 Pain in left leg: Secondary | ICD-10-CM | POA: Diagnosis not present

## 2024-08-10 DIAGNOSIS — M5126 Other intervertebral disc displacement, lumbar region: Secondary | ICD-10-CM | POA: Diagnosis not present

## 2024-08-30 DIAGNOSIS — M4807 Spinal stenosis, lumbosacral region: Secondary | ICD-10-CM | POA: Diagnosis not present

## 2024-08-30 DIAGNOSIS — M5416 Radiculopathy, lumbar region: Secondary | ICD-10-CM | POA: Diagnosis not present

## 2024-09-05 ENCOUNTER — Other Ambulatory Visit: Payer: Self-pay | Admitting: Family Medicine

## 2024-09-05 DIAGNOSIS — Z1231 Encounter for screening mammogram for malignant neoplasm of breast: Secondary | ICD-10-CM

## 2024-10-24 DIAGNOSIS — E785 Hyperlipidemia, unspecified: Secondary | ICD-10-CM | POA: Diagnosis not present

## 2024-10-27 DIAGNOSIS — E7800 Pure hypercholesterolemia, unspecified: Secondary | ICD-10-CM | POA: Diagnosis not present

## 2024-10-27 DIAGNOSIS — L602 Onychogryphosis: Secondary | ICD-10-CM | POA: Diagnosis not present

## 2024-11-28 ENCOUNTER — Ambulatory Visit
Admission: RE | Admit: 2024-11-28 | Discharge: 2024-11-28 | Disposition: A | Source: Ambulatory Visit | Attending: Family Medicine

## 2024-11-28 DIAGNOSIS — Z1231 Encounter for screening mammogram for malignant neoplasm of breast: Secondary | ICD-10-CM

## 2024-12-01 DIAGNOSIS — D485 Neoplasm of uncertain behavior of skin: Secondary | ICD-10-CM | POA: Diagnosis not present

## 2024-12-01 DIAGNOSIS — D1801 Hemangioma of skin and subcutaneous tissue: Secondary | ICD-10-CM | POA: Diagnosis not present

## 2024-12-01 DIAGNOSIS — L570 Actinic keratosis: Secondary | ICD-10-CM | POA: Diagnosis not present

## 2024-12-01 DIAGNOSIS — L814 Other melanin hyperpigmentation: Secondary | ICD-10-CM | POA: Diagnosis not present

## 2024-12-01 DIAGNOSIS — L821 Other seborrheic keratosis: Secondary | ICD-10-CM | POA: Diagnosis not present

## 2025-04-05 ENCOUNTER — Other Ambulatory Visit

## 2025-04-05 ENCOUNTER — Ambulatory Visit (HOSPITAL_BASED_OUTPATIENT_CLINIC_OR_DEPARTMENT_OTHER)

## 2025-04-17 ENCOUNTER — Ambulatory Visit: Admitting: Obstetrics and Gynecology
